# Patient Record
Sex: Male | Born: 1970 | Race: White | Hispanic: No | Marital: Married | Smoking: Current every day smoker
Health system: Southern US, Community
[De-identification: ages and names within clinical notes are randomized; demographics above are authoritative.]

## PROBLEM LIST (undated history)

## (undated) DIAGNOSIS — I219 Acute myocardial infarction, unspecified: Secondary | ICD-10-CM

## (undated) DIAGNOSIS — S069XAA Unspecified intracranial injury with loss of consciousness status unknown, initial encounter: Secondary | ICD-10-CM

## (undated) DIAGNOSIS — I2510 Atherosclerotic heart disease of native coronary artery without angina pectoris: Secondary | ICD-10-CM

## (undated) DIAGNOSIS — G939 Disorder of brain, unspecified: Secondary | ICD-10-CM

## (undated) DIAGNOSIS — N19 Unspecified kidney failure: Secondary | ICD-10-CM

## (undated) DIAGNOSIS — N200 Calculus of kidney: Secondary | ICD-10-CM

## (undated) DIAGNOSIS — G4730 Sleep apnea, unspecified: Secondary | ICD-10-CM

## (undated) DIAGNOSIS — R011 Cardiac murmur, unspecified: Secondary | ICD-10-CM

## (undated) DIAGNOSIS — S069X9A Unspecified intracranial injury with loss of consciousness of unspecified duration, initial encounter: Secondary | ICD-10-CM

## (undated) HISTORY — DX: Unspecified kidney failure: N19

## (undated) HISTORY — DX: Sleep apnea, unspecified: G47.30

## (undated) HISTORY — DX: Calculus of kidney: N20.0

## (undated) HISTORY — PX: OTHER SURGICAL HISTORY: SHX169

## (undated) HISTORY — DX: Disorder of brain, unspecified: G93.9

## (undated) HISTORY — DX: Unspecified intracranial injury with loss of consciousness of unspecified duration, initial encounter: S06.9X9A

## (undated) HISTORY — DX: Cardiac murmur, unspecified: R01.1

---

## 2005-01-03 HISTORY — PX: CORONARY ANGIOPLASTY WITH STENT PLACEMENT: SHX49

## 2015-03-16 ENCOUNTER — Emergency Department
Admission: EM | Admit: 2015-03-16 | Discharge: 2015-03-16 | Disposition: A | Discharge status: Home / Self Care | Payer: Medicare Other | Attending: Emergency Medicine | Admitting: Emergency Medicine

## 2015-03-16 ENCOUNTER — Emergency Department: Payer: Medicare Other

## 2015-03-16 ENCOUNTER — Encounter: Payer: Self-pay | Admitting: Emergency Medicine

## 2015-03-16 DIAGNOSIS — N23 Unspecified renal colic: Secondary | ICD-10-CM | POA: Diagnosis not present

## 2015-03-16 DIAGNOSIS — N289 Disorder of kidney and ureter, unspecified: Secondary | ICD-10-CM | POA: Insufficient documentation

## 2015-03-16 DIAGNOSIS — N200 Calculus of kidney: Secondary | ICD-10-CM | POA: Diagnosis not present

## 2015-03-16 DIAGNOSIS — Z87828 Personal history of other (healed) physical injury and trauma: Secondary | ICD-10-CM | POA: Insufficient documentation

## 2015-03-16 DIAGNOSIS — R109 Unspecified abdominal pain: Secondary | ICD-10-CM

## 2015-03-16 DIAGNOSIS — F17210 Nicotine dependence, cigarettes, uncomplicated: Secondary | ICD-10-CM | POA: Diagnosis not present

## 2015-03-16 HISTORY — DX: Acute myocardial infarction, unspecified: I21.9

## 2015-03-16 HISTORY — DX: Atherosclerotic heart disease of native coronary artery without angina pectoris: I25.10

## 2015-03-16 LAB — URINALYSIS COMPLETE WITH MICROSCOPIC (ARMC ONLY)
Bilirubin Urine: NEGATIVE
Glucose, UA: NEGATIVE mg/dL
Leukocytes, UA: NEGATIVE
Nitrite: NEGATIVE
PROTEIN: 100 mg/dL — AB
Specific Gravity, Urine: 1.033 — ABNORMAL HIGH (ref 1.005–1.030)
pH: 5 (ref 5.0–8.0)

## 2015-03-16 LAB — CBC
HCT: 44.8 % (ref 40.0–52.0)
HEMOGLOBIN: 15.1 g/dL (ref 13.0–18.0)
MCH: 31.4 pg (ref 26.0–34.0)
MCHC: 33.6 g/dL (ref 32.0–36.0)
MCV: 93.3 fL (ref 80.0–100.0)
Platelets: 222 10*3/uL (ref 150–440)
RBC: 4.8 MIL/uL (ref 4.40–5.90)
RDW: 13.4 % (ref 11.5–14.5)
WBC: 13.6 10*3/uL — ABNORMAL HIGH (ref 3.8–10.6)

## 2015-03-16 LAB — COMPREHENSIVE METABOLIC PANEL
ALBUMIN: 4.4 g/dL (ref 3.5–5.0)
ALK PHOS: 63 U/L (ref 38–126)
ALT: 18 U/L (ref 17–63)
ANION GAP: 10 (ref 5–15)
AST: 38 U/L (ref 15–41)
BUN: 17 mg/dL (ref 6–20)
CALCIUM: 9.4 mg/dL (ref 8.9–10.3)
CO2: 22 mmol/L (ref 22–32)
Chloride: 106 mmol/L (ref 101–111)
Creatinine, Ser: 1.74 mg/dL — ABNORMAL HIGH (ref 0.61–1.24)
GFR calc Af Amer: 53 mL/min — ABNORMAL LOW (ref >60)
GFR calc non Af Amer: 46 mL/min — ABNORMAL LOW (ref >60)
GLUCOSE: 150 mg/dL — AB (ref 65–99)
Potassium: 3.8 mmol/L (ref 3.5–5.1)
SODIUM: 138 mmol/L (ref 135–145)
Total Bilirubin: 1.3 mg/dL — ABNORMAL HIGH (ref 0.3–1.2)
Total Protein: 6.9 g/dL (ref 6.5–8.1)

## 2015-03-16 LAB — LIPASE, BLOOD: Lipase: 20 U/L (ref 11–51)

## 2015-03-16 MED ORDER — HYDROMORPHONE HCL 2 MG PO TABS
2.0000 mg | ORAL_TABLET | Freq: Once | ORAL | Status: AC
  Administered 2015-03-16: 2 mg via ORAL
  Filled 2015-03-16: qty 1

## 2015-03-16 MED ORDER — ONDANSETRON HCL 4 MG/2ML IJ SOLN
4.0000 mg | Freq: Once | INTRAMUSCULAR | Status: AC
  Administered 2015-03-16: 4 mg via INTRAVENOUS
  Filled 2015-03-16: qty 2

## 2015-03-16 MED ORDER — SODIUM CHLORIDE 0.9 % IV BOLUS (SEPSIS)
500.0000 mL | Freq: Once | INTRAVENOUS | Status: AC
  Administered 2015-03-16: 500 mL via INTRAVENOUS

## 2015-03-16 MED ORDER — ONDANSETRON HCL 4 MG PO TABS: 4.0000 mg | ORAL_TABLET | Freq: Three times a day (TID) | ORAL | Status: DC | PRN

## 2015-03-16 MED ORDER — KETOROLAC TROMETHAMINE 30 MG/ML IJ SOLN
30.0000 mg | Freq: Once | INTRAMUSCULAR | Status: AC
  Administered 2015-03-16: 30 mg via INTRAVENOUS
  Filled 2015-03-16: qty 1

## 2015-03-16 MED ORDER — HYDROMORPHONE HCL 2 MG PO TABS: 2.0000 mg | ORAL_TABLET | Freq: Four times a day (QID) | ORAL | Status: DC | PRN

## 2015-03-16 NOTE — ED Notes (Signed)
AAOx3.  Skin warm and dry.  States pain is 0/10.  Urine strainer given.  D/C home

## 2015-03-16 NOTE — ED Notes (Signed)
Resting.  Easy to wake  NAD.  Continue to monitor.

## 2015-03-16 NOTE — Discharge Instructions (Signed)
Kidney Stones °Kidney stones (urolithiasis) are deposits that form inside your kidneys. The intense pain is caused by the stone moving through the urinary tract. When the stone moves, the ureter goes into spasm around the stone. The stone is usually passed in the urine.  °CAUSES  °· A disorder that makes certain neck glands produce too much parathyroid hormone (primary hyperparathyroidism). °· A buildup of uric acid crystals, similar to gout in your joints. °· Narrowing (stricture) of the ureter. °· A kidney obstruction present at birth (congenital obstruction). °· Previous surgery on the kidney or ureters. °· Numerous kidney infections. °SYMPTOMS  °· Feeling sick to your stomach (nauseous). °· Throwing up (vomiting). °· Blood in the urine (hematuria). °· Pain that usually spreads (radiates) to the groin. °· Frequency or urgency of urination. °DIAGNOSIS  °· Taking a history and physical exam. °· Blood or urine tests. °· CT scan. °· Occasionally, an examination of the inside of the urinary bladder (cystoscopy) is performed. °TREATMENT  °· Observation. °· Increasing your fluid intake. °· Extracorporeal shock wave lithotripsy--This is a noninvasive procedure that uses shock waves to break up kidney stones. °· Surgery may be needed if you have severe pain or persistent obstruction. There are various surgical procedures. Most of the procedures are performed with the use of small instruments. Only small incisions are needed to accommodate these instruments, so recovery time is minimized. °The size, location, and chemical composition are all important variables that will determine the proper choice of action for you. Talk to your health care provider to better understand your situation so that you will minimize the risk of injury to yourself and your kidney.  °HOME CARE INSTRUCTIONS  °· Drink enough water and fluids to keep your urine clear or pale yellow. This will help you to pass the stone or stone fragments. °· Strain  all urine through the provided strainer. Keep all particulate matter and stones for your health care provider to see. The stone causing the pain may be as small as a grain of salt. It is very important to use the strainer each and every time you pass your urine. The collection of your stone will allow your health care provider to analyze it and verify that a stone has actually passed. The stone analysis will often identify what you can do to reduce the incidence of recurrences. °· Only take over-the-counter or prescription medicines for pain, discomfort, or fever as directed by your health care provider. °· Keep all follow-up visits as told by your health care provider. This is important. °· Get follow-up X-rays if required. The absence of pain does not always mean that the stone has passed. It may have only stopped moving. If the urine remains completely obstructed, it can cause loss of kidney function or even complete destruction of the kidney. It is your responsibility to make sure X-rays and follow-ups are completed. Ultrasounds of the kidney can show blockages and the status of the kidney. Ultrasounds are not associated with any radiation and can be performed easily in a matter of minutes. °· Make changes to your daily diet as told by your health care provider. You may be told to: °¨ Limit the amount of salt that you eat. °¨ Eat 5 or more servings of fruits and vegetables each day. °¨ Limit the amount of meat, poultry, fish, and eggs that you eat. °· Collect a 24-hour urine sample as told by your health care provider. You may need to collect another urine sample every 6-12   months. SEEK MEDICAL CARE IF:  You experience pain that is progressive and unresponsive to any pain medicine you have been prescribed. SEEK IMMEDIATE MEDICAL CARE IF:   Pain cannot be controlled with the prescribed medicine.  You have a fever or shaking chills.  The severity or intensity of pain increases over 18 hours and is not  relieved by pain medicine.  You develop a new onset of abdominal pain.  You feel faint or pass out.  You are unable to urinate.   This information is not intended to replace advice given to you by your health care provider. Make sure you discuss any questions you have with your health care provider.   Document Released: 12/20/2004 Document Revised: 09/10/2014 Document Reviewed: 05/23/2012 Else[employer] Interactive Patient Education [employer]2016 [employer]   Please return immediately if condition worsens. Please contact her primary physician or the physician you were given for referral. If you have any specialist physicians involved in her treatment and plan please also contact them. Thank you for using [facility]. Please drink plenty of fluids and strain urine. Please save the stone if one becomes available. Return here to emergency department especially for uncontrolled pain, uncontrolled vomiting, or fever. He can take over-the-counter Tylenol as needed for pain also.

## 2015-03-16 NOTE — ED Provider Notes (Signed)
Time Seen: Approximately *----------------------------------------- 7:48 PM on 03/16/2015 -----------------------------------------   I have reviewed the triage notes  Chief Complaint: Abdominal Pain   History of Present Illness: Steven Archer is a 45 y.o. male who presents with acute onset approximately 2:30 this afternoon of left-sided abdominal pain. His family members with him states that he has a history of a total brain injury and there is previous concern for giving pain medication. Patient had a history of recent nausea and vomiting without changes in his mental status. No loose stool or diarrhea. The family member states that he's described issues of bowel urgency and urinary tension urgency. They're not aware of any loose stool or diarrhea, melena or hematochezia. No hematemesis.   Past Medical History  Diagnosis Date  . MI (myocardial infarction) (HCC)   . Coronary artery disease     There are no active problems to display for this patient.   Past Surgical History  Procedure Laterality Date  . Coronary angioplasty with stent placement      Past Surgical History  Procedure Laterality Date  . Coronary angioplasty with stent placement      No current outpatient prescriptions on file.  Allergies:  Review of patient's allergies indicates no known allergies.  Family History: No family history on file.  Social History: Social History  Substance Use Topics  . Smoking status: Current Every Day Smoker -- 1.00 packs/day    Types: Cigarettes  . Smokeless tobacco: Never Used  . Alcohol Use: Yes     Comment: rare     Review of Systems:   10 point review of systems was performed and was otherwise negative: Patient himself is a poor historian due to his history of total brain injury and is not able to offer any review of systems. Review of systems mainly acquired from the family members with the patient Constitutional: No fever Eyes: No visual disturbances ENT:  No sore throat, ear pain Cardiac: No chest pain Respiratory: No shortness of breath, wheezing, or stridor Abdomen: Pain is exclusively on the left side abdomen the patient points to the left lower quadrant. The pain radiates toward the left flank area. He denies any testicular pain Endocrine: No weight loss, No night sweats Extremities: No peripheral edema, cyanosis Skin: No rashes, easy bruising Neurologic: No focal weakness, trouble with speech or swollowing Urologic: No dysuria, Hematuria, or urinary frequency   Physical Exam:  ED Triage Vitals  Enc Vitals Group     BP 03/16/15 1922 109/75 mmHg     Pulse Rate 03/16/15 1922 75     Resp 03/16/15 1922 20     Temp 03/16/15 1922 98.4 F (36.9 C)     Temp Source 03/16/15 1922 Oral     SpO2 03/16/15 1922 100 %     Weight 03/16/15 1922 165 lb (74.844 kg)     Height 03/16/15 1922  (1.778 m)     Head Cir --      Peak Flow --      Pain Score 03/16/15 1923 10     Pain Loc --      Pain Edu? --      Excl. in GC? --     General: Awake , Alert , and Oriented times 3; GCS 15 patient appears to be in obvious discomfort Head: Normal cephalic , atraumatic Eyes: Pupils equal , round, reactive to light Nose/Throat: No nasal drainage, patent upper airway without erythema or exudate.  Neck: Supple, Full range of motion,  No anterior adenopathy or palpable thyroid masses Lungs: Clear to ascultation without wheezes , rhonchi, or rales Heart: Regular rate, regular rhythm without murmurs , gallops , or rubs Abdomen: Soft, non tender without rebound, guarding , or rigidity; bowel sounds positive and symmetric in all 4 quadrants. No organomegaly .   No obvious reproducible abdominal or flank pain     Extremities: 2 plus symmetric pulses. No edema, clubbing or cyanosis Neurologic: normal ambulation, Motor symmetric without deficits, sensory intact Skin: warm, dry, no rashes   Labs:   All laboratory work was reviewed including any pertinent  negatives or positives listed below:  Labs Reviewed  CBC - Abnormal; Notable for the following:    WBC 13.6 (*)    All other components within normal limits  URINALYSIS COMPLETEWITH MICROSCOPIC ([facility] ONLY) - Abnormal; Notable for the following:    Color, Urine AMBER (*)    APPearance CLEAR (*)    Ketones, ur 1+ (*)    Specific Gravity, Urine 1.033 (*)    Hgb urine dipstick 3+ (*)    Protein, ur 100 (*)    Bacteria, UA RARE (*)    Squamous Epithelial / LPF 0-5 (*)    All other components within normal limits  LIPASE, BLOOD  COMPREHENSIVE METABOLIC PANEL   of note is some indications of prerenal insufficiency.  Radiology: *    EXAM: CT ABDOMEN AND PELVIS WITHOUT CONTRAST  TECHNIQUE: Multidetector CT imaging of the abdomen and pelvis was performed following the standard protocol without IV contrast.  COMPARISON: None.  FINDINGS: Lung bases are normal.  Abdominal images demonstrate a normal liver, spleen, pancreas, gallbladder and adrenal glands. Stomach is normal.  Kidneys are normal in size without evidence of nephrolithiasis. There is minimal dilatation of the left intrarenal collecting system and ureter. There is a 1-2 mm stone at the left UVJ. Right ureter is normal.  Appendix is normal. Small bowel and colon are within normal. Mesentery is normal.  Minimal calcified plaque over the abdominal aorta and iliac arteries.  Pelvic images demonstrate the bladder, prostate and rectum to be within normal. Remaining bones and soft tissues are within normal.  IMPRESSION: 1-2 mm stone at the left UVJ causing low-grade obstruction.   Electronically Signed By: Elberta Fortis M.D. On: 03/16/2015 20:33   I personally reviewed the radiologic studies    ED Course:  The patient has a history of a traumatic brain injury and his caretaker is with him at the bedside states they were told by multiple neurologists not to given any medication that may alter his mental  status. Patient had a clinical presentation for what appeared to be renal colic. He was given Toradol with some symptomatic improvement but still had some consistent pain. His CAT scan shows what appears to be a small partially obstructing left-sided kidney stone at the left UVJ region. The stone has a very good chance of passing in the near future. Patient still had persistent pain and also has the appearance of renal insufficiency by laboratory work. Given the elevated BUN/creatinine ratio this most likely is prerenal. I felt it was not due to the patient's kidney stone at this time especiall given the size and partial  obstructive pattern. This however makes it difficult to give the patient nonsteroidal medications this may further affect his kidney function. Patient was given oral Dilaudid here which seemed eradicated his pain and he was able to maintain essentially his baseline mental function and felt symptomatically improved. I felt that the  narcotic pain medication was necessary due to renal colic and that I could not foresee any further damage or persistent symptoms from brief interrupted tablets of Dilaudid for pain. The caretaker was prescribed Dilaudid just for severe pain. Advised to use over-the-counter Tylenol for other discomfort. Patient was referred to urology unassigned and [facility] what the patient's basic baseline creatinine level is. I felt we could safely discharge the patient does encouraged to drink plenty of fluids and they're advised to return here for any new concerns    Assessment: * Acute renal colic Renal insufficiency  Final Clinical Impression:  Final diagnoses:  Left sided abdominal pain     Plan:  Outpatient management Patient was advised to return immediately if condition worsens. Patient was advised to follow up with their primary care physician or other specialized physicians involved in their outpatient  care             Jennye MoccasinBrian S Quigley, MD 03/16/15 [id number]

## 2015-03-16 NOTE — ED Notes (Signed)
C/o lower abdominal pain.  Onset of symptoms this morning.  C/o suprapubic pain and "difficulty urinating" started today.  alos c/o left side and left lower back pain.

## 2015-03-16 NOTE — ED Notes (Signed)
TO CT Scan via stretcher 

## 2015-03-30 ENCOUNTER — Ambulatory Visit (INDEPENDENT_AMBULATORY_CARE_PROVIDER_SITE_OTHER): Payer: Medicare Other | Admitting: Urology

## 2015-03-30 ENCOUNTER — Encounter: Payer: Self-pay | Admitting: Urology

## 2015-03-30 VITALS — BP 93/64 | HR 73 | Ht 70.0 in | Wt 157.7 lb

## 2015-03-30 DIAGNOSIS — R310 Gross hematuria: Secondary | ICD-10-CM | POA: Diagnosis not present

## 2015-03-30 DIAGNOSIS — Z87442 Personal history of urinary calculi: Secondary | ICD-10-CM | POA: Diagnosis not present

## 2015-03-30 DIAGNOSIS — R1030 Lower abdominal pain, unspecified: Secondary | ICD-10-CM | POA: Diagnosis not present

## 2015-03-30 LAB — MICROSCOPIC EXAMINATION
BACTERIA UA: NONE SEEN
EPITHELIAL CELLS (NON RENAL): NONE SEEN /HPF (ref 0–10)
RBC, UA: NONE SEEN /hpf (ref 0–?)
WBC, UA: NONE SEEN /hpf (ref 0–?)

## 2015-03-30 LAB — URINALYSIS, COMPLETE
Bilirubin, UA: NEGATIVE
GLUCOSE, UA: NEGATIVE
Ketones, UA: NEGATIVE
LEUKOCYTES UA: NEGATIVE
Nitrite, UA: NEGATIVE
PROTEIN UA: NEGATIVE
RBC, UA: NEGATIVE
Specific Gravity, UA: 1.02 (ref 1.005–1.030)
Urobilinogen, Ur: 0.2 mg/dL (ref 0.2–1.0)
pH, UA: 6.5 (ref 5.0–7.5)

## 2015-03-30 LAB — BLADDER SCAN AMB NON-IMAGING: Scan Result: 13

## 2015-03-30 NOTE — Progress Notes (Signed)
03/30/2015 11:48 AM   Steven Archer 11-15-1970 [id number]  Referring provider: No referring provider defined for this encounter.  Chief Complaint  Patient presents with  . Nephrolithiasis    referred by Dr. Clint GuyLindley     HPI: Patient is a 45 year old Caucasian male who was seen in the [facility] ED on 03/16/2015 and diagnosed with a 1-2 mm left UVJ stone causing low-grade obstruction with a follow [facility] ED on 03/24/2015 which noted no UVJ or hydronephrosis who is still having pain.  Patient states about 2 weeks ago he had the sudden onset of lower abdominal pain which radiated more towards the left lower quadrant.  He also stated that it radiated to his left flank.  [facility] was [facility] emergency room on 03/16/2015.  At that time, he has 6-30 RBCs per high-power field on urinalysis, a serum creatinine of 1.74 and the CT scan which noted a 1-2 mm left UVJ stone causing a low-grade obstruction.  He was instructed to strain his urine and was discharged with Percocet, Flomax and Zofran.  The pain continued and they sought further treatment and [facility] department on 03/24/2015.  CT scan performed at that time noted no ureterolithiasis, no hydronephrosis or other acute findings.  Patient's urinalysis from that visit was negative for RBC"s and his creatinine was 1.24.  He states he had an episode of gross hematuria after he returned from the emergency room that evening.  At some point, patient states ultrasound was performed at his primary care physician's office and was told that he had bilateral nephrolithiasis and a 12 mm stone in his ureter.  I do not have access to those records.    Today, patient is still complaining of the lower abdominal pain. He denies any further gross hematuria or dysuria.  He has baseline urinary symptoms of nocturia, intermittency, hesitancy, straining to urinate and a weak urinary stream.  PVR today's visit was 13 mL. His UA today was  normal.  I cannot personally review either CT at this time. The CT scan performed at [facility]'s emergency room may not be loaded into the system for I cannot access and on canopy.     PMH: Past Medical History  Diagnosis Date  . MI (myocardial infarction) (HCC)   . Coronary artery disease   . Heart murmur   . Kidney failure   . Sleep apnea   . 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 Kidney stone     Surgical History: Past Surgical History  Procedure Laterality Date  . Coronary angioplasty with stent placement  2007    Home Medications:    Medication List       This list is accurate as of: 03/30/15 11:48 AM.  Always use your most recent med list.               aspirin EC 81 MG tablet  Take 81 mg by mouth daily.     HYDROmorphone 2 MG tablet  Commonly known as:  DILAUDID  Take 1 tablet (2 mg total) by mouth every 6 (six) hours as needed for severe pain.     ibuprofen 800 MG tablet  Commonly known as:  ADVIL,MOTRIN  Take 800 mg by mouth every 8 (eight) hours as needed.     ondansetron 4 MG tablet  Commonly known as:  ZOFRAN  Take 1 tablet (4 mg total) by mouth every  8 (eight) hours as needed for nausea or vomiting.     oxyCODONE-acetaminophen 5-325 MG tablet  Commonly known as:  PERCOCET/ROXICET  Take by mouth. Reported on 03/30/2015     predniSONE 10 MG tablet  Commonly known as:  DELTASONE  Take 10 mg by mouth.     rOPINIRole 0.25 MG tablet  Commonly known as:  REQUIP  Take 0.5 mg by mouth at bedtime. Reported on 03/30/2015     tamsulosin 0.4 MG Caps capsule  Commonly known as:  FLOMAX  Take 0.4 mg by mouth.        Allergies: No Known Allergies  Family History: Family History  Problem Relation Age of Onset  . Kidney disease Neg Hx   . Prostate cancer Neg Hx   . Urolithiasis Father     Social History:  reports that he has been smoking Cigarettes.  He has been smoking about 1.00 pack per day. He has never used smokeless tobacco. He reports that  he drinks alcohol. He reports that he does not use illicit drugs.  ROS: UROLOGY Frequent Urination?: No Hard to postpone urination?: No Burning/pain with urination?: No Get up at night to urinate?: Yes Leakage of urine?: No Urine stream starts and stops?: Yes Trouble starting stream?: Yes Do you have to strain to urinate?: Yes Blood in urine?: Yes Urinary tract infection?: No Sexually transmitted disease?: No Injury to kidneys or bladder?: No Painful intercourse?: No Weak stream?: Yes Erection problems?: No Penile pain?: No  Gastrointestinal Nausea?: Yes Vomiting?: Yes Indigestion/heartburn?: No Diarrhea?: No Constipation?: No  Constitutional Fever: Yes Night sweats?: No Weight loss?: Yes Fatigue?: Yes  Skin Skin rash/lesions?: No Itching?: No  Eyes Blurred vision?: Yes Double vision?: No  Ears/Nose/Throat Sore throat?: No Sinus problems?: No  Hematologic/Lymphatic Swollen glands?: No Easy bruising?: No  Cardiovascular Leg swelling?: No Chest pain?: Yes  Respiratory Cough?: No Shortness of breath?: Yes  Endocrine Excessive thirst?: No  Musculoskeletal Back pain?: Yes Joint pain?: No  Neurological Headaches?: Yes Dizziness?: Yes  Psychologic Depression?: No Anxiety?: No  Physical Exam: BP 93/64 mmHg  Pulse 73  Ht  (1.778 m)  Wt 157 lb 11.2 oz (71.532 kg)  BMI 22.63 kg/m2  Constitutional: Well nourished. Alert and oriented, No acute distress. HEENT: [place] AT, moist mucus membranes. Trachea midline, no masses. Cardiovascular: No clubbing, cyanosis, or edema. Respiratory: Normal respiratory effort, no increased work of breathing. GI: Abdomen is soft, non tender, non distended, no abdominal masses. Liver and spleen not palpable.  No hernias appreciated.  Stool sample for occult testing is not indicated.   GU: No CVA tenderness.  No bladder fullness or masses.  Patient with circumcised phallus.   Urethral meatus is patent.  No penile  discharge. No penile lesions or rashes. Scrotum without lesions, cysts, rashes and/or edema.  Testicles are located scrotally bilaterally and they are atrophic.  No masses are appreciated in the testicles. Left and right epididymis are normal. Rectal: Patient with  normal sphincter tone. Anus and perineum without scarring or rashes. No rectal masses are appreciated. Prostate is approximately 50 grams, no nodules are appreciated. Seminal vesicles are normal. Skin: No rashes, bruises or suspicious lesions. Lymph: No cervical or inguinal adenopathy. Neurologic: Grossly intact, no focal deficits, moving all 4 extremities. Psychiatric: Normal mood and affect.  Laboratory Data: Lab Results  Component Value Date   WBC 13.6* 03/16/2015   HGB 15.1 03/16/2015   HCT 44.8 03/16/2015   MCV 93.3 03/16/2015   PLT 222  03/16/2015    Lab Results  Component Value Date   CREATININE 1.74* 03/16/2015    Lab Results  Component Value Date   AST 38 03/16/2015   Lab Results  Component Value Date   ALT 18 03/16/2015     Urinalysis Results for orders placed or performed in visit on 03/30/15  Microscopic Examination  Result Value Ref Range   WBC, UA None seen 0 -  5 /hpf   RBC, UA None seen 0 -  2 /hpf   Epithelial Cells (non renal) None seen 0 - 10 /hpf   Bacteria, UA None seen None seen/Few  Urinalysis, Complete  Result Value Ref Range   Specific Gravity, UA 1.020 1.005 - 1.030   pH, UA 6.5 5.0 - 7.5   Color, UA Yellow Yellow   Appearance Ur Clear Clear   Leukocytes, UA Negative Negative   Protein, UA Negative Negative/Trace   Glucose, UA Negative Negative   Ketones, UA Negative Negative   RBC, UA Negative Negative   Bilirubin, UA Negative Negative   Urobilinogen, Ur 0.2 0.2 - 1.0 mg/dL   Nitrite, UA Negative Negative   Microscopic Examination See below:   BLADDER SCAN AMB NON-IMAGING  Result Value Ref Range   Scan Result 13     Pertinent Imaging: CLINICAL DATA: Acute left flank  pain since this morning.  EXAM: CT ABDOMEN AND PELVIS WITHOUT CONTRAST  TECHNIQUE: Multidetector CT imaging of the abdomen and pelvis was performed following the standard protocol without IV contrast.  COMPARISON: None.  FINDINGS: Lung bases are normal.  Abdominal images demonstrate a normal liver, spleen, pancreas, gallbladder and adrenal glands. Stomach is normal.  Kidneys are normal in size without evidence of nephrolithiasis. There is minimal dilatation of the left intrarenal collecting system and ureter. There is a 1-2 mm stone at the left UVJ. Right ureter is normal.  Appendix is normal. Small bowel and colon are within normal. Mesentery is normal.  Minimal calcified plaque over the abdominal aorta and iliac arteries.  Pelvic images demonstrate the bladder, prostate and rectum to be within normal. Remaining bones and soft tissues are within normal.  IMPRESSION: 1-2 mm stone at the left UVJ causing low-grade obstruction.   Electronically Signed  By: Elberta Fortis M.D.  On: 03/16/2015 20:33  EXAM: CT abdomen and pelvis without contrast DATE: 03/24/15 13:45:00 ACCESSION: [id number] UN DICTATED: 03/24/15 13:55:09 INTERPRETATION LOCATION: [facility]  CLINICAL INDICATION: 45 Year Old (M): CALCULUS OF URETER-    COMPARISON: CT abdomen and pelvis 10/28/11  TECHNIQUE: A spiral CT scan was obtained without IV contrast from the top of the kidneys through the entire bladder.  Images were reconstructed in the axial plane. Coronal and sagittal reformatted images were also provided for further evaluation.  FINDINGS: Evaluation of the solid organs and vasculature is limited in the absence of intravenous contrast.  VISUALIZED CHEST: The lung bases are clear. Visualized heart is within normal limits.  ABDOMEN/PELVIS: Hepatobiliary: Unremarkable.  Gallbladder: Unremarkable.  Spleen: Unremarkable.  Pancreas: Unremarkable.  Adrenal glands:  Unremarkable. Kidneys: No evidence of urinary calculi.  Bowel/Peritoneum: No free intraperitoneal gas. The large and small bowel are unremarkable. The appendix is not visualized, however there are no secondary signs of appendicitis.  There is no evidence of bowel obstruction. No free fluid.  Pelvis: The pelvic organs are within normal limits. The bladder is unremarkable.  Vasculature: The aorta is normal in size and contour. Scattered calcified atherosclerotic plaque. Unremarkable inferior vena cava.   Lymph Nodes: There is  no abdominal or pelvic lymphadenopathy by size criteria.   Soft Tissue/Bones: There are no acute osseous abnormalities or focal osseous lesions.  IMPRESSION: No urolithiasis.  Results for SHIV, SHUEY (MRN [id number]) as of 03/30/2015 13:19  Ref. Range 03/30/2015 11:56  Scan Result Unknown 13   Assessment & Plan:    1. Gross hematuria:   Patient endorses an episode of gross hematuria after a negative CT scan for stones.   He is a smoker and is high risk for GU malignancies.   I explained to him it would be prudent at this time to undergo cystoscopy with bilateral retrogrades to complete his hematuria workup.  He may have a GU malignancy.   I did emphasize that this may not find an etiology for his lower abdominal pain as all the current urological studies are negative.   He stated he would like to speak with his primary care physician and obtain their opinion whether they should undergo this study.   - Urinalysis, Complete - CULTURE, URINE COMPREHENSIVE  2.  Lower abdominal pain:   Patient with lower abdominal pain.   I have advised the patient to seek further evaluation with his primary care physician as his urological studies are negative.     3. Left UVJ stone:   Patient most likely spontaneously passed the 2 mm left UVJ stone.  He was not visible on the CT scan performed at [facility] on 03/24/2015.  His UA today is unremarkable.     Return for follow up with his  PCP.  These notes generated with voice recognition software. I apologize for typographical errors.  Michiel Cowboy, PA-C  [facility] [address], [address] [place], [place] [address] [phone]

## 2015-04-01 LAB — CULTURE, URINE COMPREHENSIVE

## 2015-04-02 ENCOUNTER — Telehealth: Payer: Self-pay

## 2015-04-02 NOTE — Telephone Encounter (Signed)
LMOM

## 2015-04-02 NOTE — Telephone Encounter (Signed)
-----   Message from Harle BattiestShannon A McGowan, PA-C sent at 04/01/2015  2:07 PM EDT ----- Patient does not have an urinary tract infection.  Has he made a decision about having bilateral retrogrades for the blood in his urine?

## 2015-04-06 NOTE — Telephone Encounter (Signed)
Spoke with pt and wife in reference to UTI and bilateral retrogrades. Both stated that PCP drew a PSA that was normal therefore he do not want retrogrades done at this time.

## 2015-04-06 NOTE — Telephone Encounter (Signed)
We will need to send the patient a certified letter.  It should say,  Mr. Steven Archer: I have recommended that you undergo cystoscopy with bilateral retrogrades, this is were you are put to sleep and a small instrument is passed into your bladder to visialize the inside of the bladder and then shoot dye up the tubes that drain the kidneys to look for cancer.  This is because you had an episode of urinating blood.  Urinating blood is a possible sign of cancer.  A CT scan that is performed with out dye cannot completely rule out a kidney or bladder cancer.  You are a smoker and this puts you at higher risk for these cancers.  A PSA will not rule out bladder or kidney cancer.  PSA is a tool we use to evaluate a man for prostate cancer.  In the [place], approximately 77,000 new cases and 16,000 deaths occur each year due to bladder cancer.  Please contact our office to schedule this procedure.

## 2015-04-07 ENCOUNTER — Telehealth: Payer: Self-pay

## 2015-04-07 NOTE — Telephone Encounter (Signed)
A certified letter was sent. Ref #- [id number]

## 2018-01-23 ENCOUNTER — Ambulatory Visit
Admission: RE | Admit: 2018-01-23 | Discharge: 2018-01-23 | Disposition: A | Discharge status: Home / Self Care | Payer: Medicare Other | Attending: Adult Health | Admitting: Adult Health

## 2018-01-23 ENCOUNTER — Ambulatory Visit
Admission: RE | Admit: 2018-01-23 | Discharge: 2018-01-23 | Disposition: A | Discharge status: Home / Self Care | Payer: Medicare Other | Source: Ambulatory Visit | Attending: Adult Health | Admitting: Adult Health

## 2018-01-23 ENCOUNTER — Other Ambulatory Visit: Payer: Self-pay | Admitting: Adult Health

## 2018-01-23 DIAGNOSIS — R0602 Shortness of breath: Secondary | ICD-10-CM

## 2018-01-23 DIAGNOSIS — R059 Cough, unspecified: Secondary | ICD-10-CM

## 2018-01-23 DIAGNOSIS — R05 Cough: Secondary | ICD-10-CM

## 2018-01-23 DIAGNOSIS — R52 Pain, unspecified: Secondary | ICD-10-CM | POA: Insufficient documentation

## 2019-06-26 ENCOUNTER — Ambulatory Visit: Payer: Medicare Other | Admitting: Physical Therapy

## 2019-06-26 ENCOUNTER — Encounter: Payer: Medicare Other | Admitting: Speech Pathology

## 2019-07-01 ENCOUNTER — Ambulatory Visit: Payer: Medicare Other | Attending: Physical Medicine and Rehabilitation | Admitting: Physical Therapy

## 2019-07-01 ENCOUNTER — Encounter: Payer: Self-pay | Admitting: Physical Therapy

## 2019-07-01 ENCOUNTER — Other Ambulatory Visit: Payer: Self-pay

## 2019-07-01 DIAGNOSIS — M6281 Muscle weakness (generalized): Secondary | ICD-10-CM

## 2019-07-01 DIAGNOSIS — R41841 Cognitive communication deficit: Secondary | ICD-10-CM | POA: Diagnosis present

## 2019-07-01 DIAGNOSIS — R2681 Unsteadiness on feet: Secondary | ICD-10-CM | POA: Insufficient documentation

## 2019-07-01 DIAGNOSIS — Z9181 History of falling: Secondary | ICD-10-CM | POA: Insufficient documentation

## 2019-07-02 NOTE — Therapy (Signed)
[facility] MAIN [facility] SERVICES [address] [place], [place], [address] Phone: [phone]   Fax:  [phone]  Physical Therapy Evaluation  Patient Details  Name: Steven Archer MRN: [id number] Date of Birth: [birth year] Referring Provider (PT): Shuping, Huston Foley MD   Encounter Date: 07/01/2019   PT End of Session - 07/02/19 0949    Visit Number 1    Number of Visits 17    Date for PT Re-Evaluation 08/27/19    PT Start Time 1442    PT Stop Time 1530    PT Time Calculation (min) 48 min    Equipment Utilized During Treatment Gait belt    Activity Tolerance Patient tolerated treatment well;No increased pain    Behavior During Therapy Flat affect           Past Medical History:  Diagnosis Date  . Brain damage    Blow to head with hammer  . Coronary artery disease   . Heart murmur   . Kidney failure   . Kidney stone   . MI (myocardial infarction) (HCC)   . Sleep apnea     Past Surgical History:  Procedure Laterality Date  . CORONARY ANGIOPLASTY WITH STENT PLACEMENT  2007    There were no vitals filed for this visit.    Subjective Assessment - 07/01/19 1444    Subjective "I was denied rehab after my accident and I still have trouble moving around"    Pertinent History 49 yo Male s/p TBI (hit in head at work) in 2014. He was denied worker's comp and then he was unable to qualify for [employer]. Therefore he has not had any rehab since accident. He has had multiple falls with most recent being in Dec 2020. His wife was present for evaluation. She reports he has had some cognitive changes since recent fall. However they deny any physical changes; He presents to therapy without AD. He reports that occasionally he will use a walking stick; In addition he will hold onto furniture when walking; He reports intermittent tingling/numbness in LUE and LLE; He also reports that his left leg will give out from time to time; Patient  reports a history of dizziness, with feeling "drunk" when walking; He also reports some difficulty with transfers;    Limitations Standing;Walking;House hold activities    How long can you sit comfortably? 30 min;    How long can you stand comfortably? unsure    How long can you walk comfortably? about 500 feet;    Diagnostic tests CT scan obtained in January 2021 showed no acute intracranial abnormalities, had MRI done 6/22, unsure of results;    Patient Stated Goals "be able to walk better. reduce fall risk/history"- walk normal    Currently in Pain? Yes    Pain Score 7     Pain Location Head    Pain Orientation Left    Pain Descriptors / Indicators Sharp;Throbbing    Pain Type Chronic pain    Pain Onset More than a month ago    Pain Frequency Constant    Aggravating Factors  light (has sensitivity to light and sound)    Pain Relieving Factors darkness, nerve blocks    Effect of Pain on Daily Activities unsure    Multiple Pain Sites No              [facility] PT Assessment - 07/01/19 1457      Assessment   Medical Diagnosis s/p TBI  2014, fall 2020    Referring Provider (PT) Shuping, Huston FoleyLee Thomas MD    Onset Date/Surgical Date --   2014, had a fall in Dec 2020   Next MD Visit this week    Prior Therapy was denied rehab following accident; No skilled PT intervention for this condition;       Precautions   Precautions Fall    Required Braces or Orthoses --   none     Restrictions   Weight Bearing Restrictions No      Balance Screen   Has the patient fallen in the past 6 months Yes    How many times? 1      Home Environment   Living Environment Private residence    Living Arrangements Spouse/significant other    Type of Home House    Home Access Stairs to enter    Entrance Stairs-Number of Steps 2-3    Entrance Stairs-Rails None    Home Layout One level    Home Equipment Shower seat - built in;Cane - single point      Prior Function   Level of Independence Independent      Vocation On disability    Vocation Requirements Worked in carpentry    Leisure fish, sitting watching TV;      Cognition   Overall Cognitive Status Impaired/Different from baseline      Observation/Other Assessments   Observations pleasant man, sits in chair with frequent twitches, left eye mostly closed; cranial nerves intact and present    Focus on Therapeutic Outcomes (FOTO)  46/100      Sensation   Light Touch Impaired by gross assessment      Coordination   Gross Motor Movements are Fluid and Coordinated Yes    Fine Motor Movements are Fluid and Coordinated No    Finger Nose Finger Test slightly dysmetric and slower on left compared to right    Heel Shin Test accurate bilaterally;      Posture/Postural Control   Posture Comments WFL      ROM / Strength   AROM / PROM / Strength AROM;Strength      AROM   Overall AROM Comments BLE are [facility] Institute[facility]FL      Strength   Strength Assessment Site Hip;Knee;Ankle    Right/Left Hip Right;Left    Right Hip Flexion 5/5    Right Hip Extension 4+/5    Right Hip ABduction 4+/5    Left Hip Flexion 4/5    Left Hip Extension 4-/5    Left Hip ABduction 4/5    Right/Left Knee Right;Left    Right Knee Flexion 5/5    Right Knee Extension 5/5    Left Knee Flexion 4/5    Left Knee Extension 4/5    Right/Left Ankle Right;Left    Right Ankle Dorsiflexion 5/5    Left Ankle Dorsiflexion 4/5      Transfers   Comments patient requires min A when transferred without pushing on arm rests with significantly unstable transfer with anterior loss of balance;       Ambulation/Gait   Gait Comments when ambulating, patient exhibits wide base of support, decreased ankle DF with heavy toe strike and pronation during initial contact, increased knee flexion during initial contact/mid stance with knee recuvatum during mid/terminal stance; requires CGA for safety without AD      Standardized Balance Assessment   Standardized Balance Assessment 10 meter walk  test;Five Times Sit to Stand    Five times sit to stand comments  24.2 sec with arms across chest, min A for safety with instability noted    10 Meter Walk 1.05 m/s without AD, min A for safety      High Level Balance   High Level Balance Comments When standing on firm surface, feet apart, pt exhibits increased lateral and anterior/posterior sway requiring min A for balance/stability; Patient has a hard time with static standing unsupported;                      Objective measurements completed on examination: See above findings.               PT Education - 07/02/19 0949    Education Details plan of care, recommendations    Person(s) Educated Patient;Spouse    Methods Explanation    Comprehension Verbalized understanding            PT Short Term Goals - 07/02/19 1000      PT SHORT TERM GOAL #1   Title Patient will be adherent to HEP at least 3x a week to improve functional strength and balance for better safety at home.    Time 4    Period Weeks    Status New    Target Date 07/30/19      PT SHORT TERM GOAL #2   Title Patient will increase BLE gross strength to 4+/5 as to improve functional strength for independent gait, increased standing tolerance and increased ADL ability.    Time 4    Period Weeks    Status New    Target Date 07/30/19             PT Long Term Goals - 07/02/19 1000      PT LONG TERM GOAL #1   Title Patient will be able to transfer sit<>Stand from a chair independently without pushing on arm rests exhibiting good safety awareness for return to PLOF.    Time 8    Period Weeks    Status New    Target Date 08/27/19      PT LONG TERM GOAL #2   Title Patient will ambulate supervision on level surface without AD with good foot clearance and no evidence of loss of balance for improved gait safety in home.    Time 8    Period Weeks    Status New    Target Date 08/27/19      PT LONG TERM GOAL #3   Title Patient will improve  FOTO score to >53/100 to indicate improved functional mobility;    Time 8    Period Weeks    Status New    Target Date 08/27/19      PT LONG TERM GOAL #4   Title Patient (< 31 years Archer) will complete five times sit to stand test in < 10 seconds indicating an increased LE strength and improved balance.    Time 8    Period Weeks    Status New    Target Date 08/27/19      PT LONG TERM GOAL #5   Title Patient will increase six minute walk test distance to >1000 for progression to community ambulator and improve gait ability    Time 8    Period Weeks    Status New    Target Date 08/27/19      Additional Long Term Goals   Additional Long Term Goals Yes      PT LONG TERM GOAL #6   Title Patient  will demonstrate an improved Berg Balance Score of > 50/56 as to demonstrate improved balance with ADLs such as sitting/standing and transfer balance and reduced fall risk.    Time 8    Period Weeks    Status New    Target Date 08/27/19                  Plan - 07/02/19 0950    Clinical Impression Statement 49 yo Male presents to therapy with complaints of difficulty walking and frequent falls after sustaining a TBI at work in 2014. Patient also recently had a significant fall in December 2020. After recent fall his wife reports that she has noticed more cognitive changes and agitated behavior. Patient had repeat CT scan with no acute abnormality. He is unsure of recent MRI results. Patient  exhibits mild weakness on LLE. he was able to exhibit good motor control with heel to shin movement. However upon standing patient exhibits increased unsteadiness with poor motor control during ambulation. He tested as a high fall risk as evidenced with 10 meter walk and 5 times sit<>Stand. He would benefit from additional assessment for balance as therapist unable to complete today due to time contraints. He would benefit from additional skilled PT intervention to improve strength, balance and mobility.     Personal Factors and Comorbidities Comorbidity 3+;Past/Current Experience;Time since onset of injury/illness/exacerbation;Education;Behavior Pattern    Comorbidities Past MI, CAD, kidney disease, sleep apnea, TBI, frequent falls    Examination-Activity Limitations Caring for Others;Locomotion Level;Squat;Stairs;Stand;Transfers    Examination-Participation Restrictions Church;Cleaning;Community Activity;Driving;Laundry;Medication Management;Personal Finances;Shop;Volunteer;Yard Work    Conservation officer, historic buildings Unstable/Unpredictable    Visual merchandiser    Rehab Potential Fair    PT Frequency 2x / week    PT Duration 8 weeks    PT Treatment/Interventions Cryotherapy;Canalith Repostioning;Electrical Stimulation;Moist Heat;Gait training;Stair training;Functional mobility training;Therapeutic activities;Therapeutic exercise;Balance training;Neuromuscular re-education;Cognitive remediation;Patient/family education;Energy conservation;Dry needling;Vestibular;Visual/perceptual remediation/compensation    PT Next Visit Plan assess balance, initiate HEP    PT Home Exercise Plan will address next session    Recommended Other Services has referral for ST for cognitive changes    Consulted and Agree with Plan of Care Patient           Patient will benefit from skilled therapeutic intervention in order to improve the following deficits and impairments:  Abnormal gait, Decreased balance, Decreased endurance, Decreased mobility, Difficulty walking, Impaired perceived functional ability, Dizziness, Decreased activity tolerance, Decreased coordination, Decreased safety awareness, Decreased strength, Pain  Visit Diagnosis: Muscle weakness (generalized)  Unsteadiness on feet  History of falling     Problem List Patient Active Problem List   Diagnosis Date Noted  . History of renal stone 03/30/2015  . Lower abdominal pain 03/30/2015  . Gross hematuria 03/30/2015     Steven Archer PT, Steven Archer 07/02/2019, 10:04 AM  [place] [facility] MAIN [facility] SERVICES [address] [place], [place], [address] Phone: [phone]   Fax:  [phone]  Name: Steven Archer MRN: [id number] Date of Birth: Mar 16, 1970

## 2019-07-03 ENCOUNTER — Other Ambulatory Visit: Payer: Self-pay

## 2019-07-03 ENCOUNTER — Ambulatory Visit: Payer: Medicare Other | Admitting: Speech Pathology

## 2019-07-03 ENCOUNTER — Encounter: Payer: Self-pay | Admitting: Speech Pathology

## 2019-07-03 DIAGNOSIS — M6281 Muscle weakness (generalized): Secondary | ICD-10-CM | POA: Diagnosis not present

## 2019-07-03 DIAGNOSIS — R41841 Cognitive communication deficit: Secondary | ICD-10-CM

## 2019-07-03 NOTE — Therapy (Addendum)
[place] [facility] MAIN [facility] SERVICES [address] [place], [place], [address] Phone: [phone]   Fax:  [phone]  Speech Language Pathology Evaluation  Patient Details  Name: Steven Archer MRN: [id number] Date of Birth: 1970-08-17 Referring Provider (SLP): Huston Foley Shuping   Encounter Date: 07/03/2019   End of Session - 07/03/19 1506    Visit Number 1    Number of Visits 25    Date for SLP Re-Evaluation 09/25/19    Authorization Type Medicare    Authorization Time Period 07/03/2019 thru 09/25/2019    Authorization - Visit Number 1    Progress Note Due on Visit 10    SLP Start Time 1300    SLP Stop Time  1400    SLP Time Calculation (min) 60 min    Activity Tolerance Patient tolerated treatment well           Past Medical History:  Diagnosis Date  . Brain damage    Blow to head with hammer  . Coronary artery disease   . Heart murmur   . Kidney failure   . Kidney stone   . MI (myocardial infarction) (HCC)   . Sleep apnea     Past Surgical History:  Procedure Laterality Date  . CORONARY ANGIOPLASTY WITH STENT PLACEMENT  2007    There were no vitals filed for this visit.   Subjective Assessment - 07/03/19 1500    Subjective pt pleasant    Patient is accompained by: Family member    Currently in Pain? Yes    Pain Score 7     Pain Location Head    Pain Orientation Left    Pain Descriptors / Indicators Sharp;Throbbing    Pain Type Chronic pain    Pain Onset More than a month ago    Pain Frequency Constant    Aggravating Factors  light (has sensitivity to light and sound)    Pain Relieving Factors darkness, nerve blocks    Multiple Pain Sites No              SLP Evaluation [facility] - 07/03/19 1500      SLP Visit Information   SLP Received On 07/03/19    Referring Provider (SLP) Huston Foley Shuping    Onset Date 2014    Medical Diagnosis TBI      Subjective   Subjective Pt appropriate, engaged, fair historian     Patient/Family Stated Goal for his memory to get better      General Information   HPI Steven Archer is a 49 y.o. male with a past medical history of TBI (2014) with worsening mood, balance, and cognitive changes after a fall in December 2020. CT head in Jan 2021 was negative for intracranial pathology. Most significant concern today worsening cognitive issues, mood outbursts, irritability and frequent falls that appear to worsen after a fall in December 2020. Patient struck his head in February 2014 after he hit his head against a beam at work. He is unsure about LOC. He does not remember the injury. Wife remembers him coming home with a large area of swelling over his head. At that time, she did ask him about the events leading to his injury, he was able to recall pieces of the events in 2014. Onset of symptoms started in March 2014 including headaches, dizziness, cognitive changes, altered mental status, frequent falls. He only had a few sessions of therapy at time of initial injury. Most recent MRI (  06/25/2019) revealed scattered periventricular and deep white matter T2/FLAIR hyperintensities, which can be seen in chronic small vessel ischemic disease. Otherwise, unremarkable MRI of the brain.     Behavioral/Cognition appropriate    Mobility Status ambulatory      Prior Functional Status   Cognitive/Linguistic Baseline Within functional limits    Type of Home House     Lives With Spouse    Available Support Family;Friend(s)    Vocation On disability      Cognition   Overall Cognitive Status Impaired/Different from baseline    Area of Impairment Attention;Memory    Current Attention Level Selective    Memory Decreased short-term memory    Attention Selective    Selective Attention Appears intact    Memory Impaired    Memory Impairment Retrieval deficit;Decreased recall of new information;Decreased short term memory    Decreased Short Term Memory Verbal basic;Functional basic     Awareness Appears intact    Problem Solving Appears intact      Auditory Comprehension   Overall Auditory Comprehension Appears within functional limits for tasks assessed      Visual Recognition/Discrimination   Discrimination Within Function Limits      Reading Comprehension   Reading Status Within funtional limits      Expression   Primary Mode of Expression Verbal      Verbal Expression   Overall Verbal Expression Appears within functional limits for tasks assessed      Written Expression   Dominant Hand Right    Written Expression Within Functional Limits      Oral Motor/Sensory Function   Overall Oral Motor/Sensory Function Appears within functional limits for tasks assessed      Motor Speech   Overall Motor Speech Appears within functional limits for tasks assessed      Standardized Assessments   Standardized Assessments  Cognitive Linguistic Quick Test      Cognitive Linguistic Quick Test (Ages 18-69)   Attention WNL    Memory Moderate    Executive Function WNL    Language WNL    Visuospatial Skills WNL    Severity Rating Total 18    Composite Severity Rating 14.8           Pt presents with moderate impairment in visual, procedural, short-term memory and retrieval of information. The Cognitive Linguistic Quick Test (CLQT) was administered to assess the relative status of five cognitive domains: attention, memory, language, executive functioning, and visuospatial skills. Scores from 10 tasks were used to estimate severity ratings (for age groups 18-69 years and 70-89 years) for each domain, a clock drawing task, as well as an overall composite severity rating of cognition.   Pt's memory impairments were further confirmed below criterion scores in design memory, story retelling and recall of personal facts such as his address. His wife further confirms that pt has difficulty recalling taking his medicine, needs reminders to brush his teeth and recalling daily  information. This is further complicated by pt's sensitivity to light and chronic intense pain over left eye.  Skilled ST is required to improve pt's cognitive deficits to increase functional independence and safety with medication management and daily safety.    Long Term Goal: Pt will improve recall of information related to daily activities to increase safety with medication administration and participation in daily activities. Baseline - moderate impairments Time: 12 weeks Status: New Target Date: 09/25/2019     SLP Education - 07/03/19 1506    Education Details Results of assessment  and POC to target memory    Person(s) Educated Patient;Spouse    Methods Explanation;Demonstration    Comprehension Verbalized understanding                Plan - 07/03/19 1508    Speech Therapy Frequency 2x / week    Duration --   12 weeks   Treatment/Interventions Compensatory strategies;Cognitive reorganization;Internal/external aids;Compensatory techniques;SLP instruction and feedback;Patient/family education;Functional tasks    Potential to Achieve Goals Good    SLP Home Exercise Plan given    Consulted and Agree with Plan of Care Patient;Family member/caregiver    Family Member Consulted wife           Patient will benefit from skilled therapeutic intervention in order to improve the following deficits and impairments:   Cognitive communication deficit    Problem List Patient Active Problem List   Diagnosis Date Noted  . History of renal stone 03/30/2015  . Lower abdominal pain 03/30/2015  . Gross hematuria 03/30/2015   Rebakah Cokley B. Dreama Saa M.S., CCC-SLP, [facility] Speech-Language Pathologist Rehabilitation Services Office [phone]  Reuel Derby 07/03/2019, 3:09 PM  [facility] MAIN [facility] SERVICES [address] [place], [place], [address] Phone: [phone]   Fax:  [phone]  Name: Steven Archer MRN: [id number] Date of  Birth: 06/15/1970

## 2019-07-10 ENCOUNTER — Encounter: Payer: Self-pay | Admitting: Physical Therapy

## 2019-07-10 ENCOUNTER — Other Ambulatory Visit: Payer: Self-pay

## 2019-07-10 ENCOUNTER — Ambulatory Visit: Payer: Medicare Other | Attending: Physical Medicine and Rehabilitation | Admitting: Physical Therapy

## 2019-07-10 DIAGNOSIS — M6281 Muscle weakness (generalized): Secondary | ICD-10-CM

## 2019-07-10 DIAGNOSIS — R41841 Cognitive communication deficit: Secondary | ICD-10-CM | POA: Diagnosis present

## 2019-07-10 DIAGNOSIS — Z9181 History of falling: Secondary | ICD-10-CM | POA: Diagnosis present

## 2019-07-10 DIAGNOSIS — R2681 Unsteadiness on feet: Secondary | ICD-10-CM | POA: Diagnosis present

## 2019-07-10 NOTE — Therapy (Signed)
[place] [facility] MAIN [facility] SERVICES [address] [place], [place], [address] Phone: [phone]   Fax:  [phone]  Physical Therapy Treatment  Patient Details  Name: Steven Archer MRN: [id number] Date of Birth: 1970-05-27 Referring Provider (PT): Shuping, Huston Foley MD   Encounter Date: 07/10/2019   PT End of Session - 07/10/19 1441    Visit Number 2    Number of Visits 17    Date for PT Re-Evaluation 08/27/19    PT Start Time 1432    PT Stop Time 1515    PT Time Calculation (min) 43 min    Equipment Utilized During Treatment Gait belt    Activity Tolerance Patient tolerated treatment well;No increased pain    Behavior During Therapy Flat affect           Past Medical History:  Diagnosis Date   Brain damage    Blow to head with hammer   Coronary artery disease    Heart murmur    Kidney failure    Kidney stone    MI (myocardial infarction) (HCC)    Sleep apnea     Past Surgical History:  Procedure Laterality Date   CORONARY ANGIOPLASTY WITH STENT PLACEMENT  2007    There were no vitals filed for this visit.   Subjective Assessment - 07/10/19 1439    Subjective Patient reports continued headache and LLE knee pain. He denies any falls in last few days. 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 He was denied worker's comp and then he was unable to qualify for [employer]. Therefore he has not had any rehab since accident. He has had multiple falls with most recent being in Dec 2020. His wife was present for evaluation. She reports he has had some cognitive changes since recent fall. However they deny any physical changes; He presents to therapy without AD. He reports that occasionally he will use a walking stick; In addition he will hold onto furniture when walking; He reports intermittent tingling/numbness in LUE and  LLE; He also reports that his left leg will give out from time to time; Patient reports a history of dizziness, with feeling "drunk" when walking; He also reports some difficulty with transfers;    Limitations Standing;Walking;House hold activities    How long can you sit comfortably? 30 min;    How long can you stand comfortably? unsure    How long can you walk comfortably? about 500 feet;    Diagnostic tests CT scan obtained in January 2021 showed no acute intracranial abnormalities, had MRI done 6/22, unsure of results;    Patient Stated Goals "be able to walk better. reduce fall risk/history"- walk normal    Currently in Pain? Yes    Pain Score 8     Pain Location Head    Pain Orientation Left    Pain Descriptors / Indicators Sharp;Throbbing    Pain Type Chronic pain    Pain Onset More than a month ago    Pain Frequency Constant    Aggravating Factors  light    Pain Relieving Factors darkness/nerve blocks    Effect of Pain on Daily Activities unsure    Multiple Pain Sites Yes    Pain Score 5    Pain Location Knee    Pain Orientation Left    Pain Descriptors / Indicators  Aching;Sore    Pain Type Chronic pain    Pain Onset More than a month ago    Pain Frequency Intermittent    Aggravating Factors  anything    Pain Relieving Factors knee brace helps some, ice pack    Effect of Pain on Daily Activities decreased activity tolerance;              [facility] PT Assessment - 07/11/19 0001      Standardized Balance Assessment   Standardized Balance Assessment Berg Balance Test      Berg Balance Test   Sit to Stand Needs minimal aid to stand or to stabilize    Standing Unsupported Needs several tries to stand 30 seconds unsupported    Sitting with Back Unsupported but Feet Supported on Floor or Stool Able to sit 2 minutes under supervision    Stand to Sit Controls descent by using hands    Transfers Able to transfer with verbal cueing and /or supervision    Standing Unsupported  with Eyes Closed Needs help to keep from falling    Standing Unsupported with Feet Together Needs help to attain position and unable to hold for 15 seconds    From Standing, Reach Forward with Outstretched Arm Reaches forward but needs supervision    From Standing Position, Pick up Object from Floor Able to pick up shoe, needs supervision    From Standing Position, Turn to Look Behind Over each Shoulder Needs assist to keep from losing balance and falling    Turn 360 Degrees Needs close supervision or verbal cueing    Standing Unsupported, Alternately Place Feet on Step/Stool Able to complete >2 steps/needs minimal assist    Standing Unsupported, One Foot in [employer] Loses balance while stepping or standing    Standing on One Leg Tries to lift leg/unable to hold 3 seconds but remains standing independently    Total Score 17    Berg comment: 100% risk for falls         TREATMENT: Warm up on Nustep BUE/BLE level 2x4 min (Unbilled); Instructed patient in balance activities to address safety, see above:  Instructed patient in balance exercise:  Sitting in chair: Sensory re-orientation Unsupported eyes open/closed 15 sec hold x2 reps; Required min VCs and visual cues for proper trunk positioning; educated patient to use mirror or target at home to facilitate improved trunk control;   Standing on firm surface with finger tip hold, feet apart: -side/side weight shift x5 reps; -heel raises x10 reps; Required close supervision to CGA for safety;   Standing one foot on firm surface one foot on 4 inch step: -unsupported standing 15 sec hold -head turns side/side x5 reps each; -eyes closed 10 sec hold, left foot on step, right foot on floor with min A and imbalance noted; While standing with left foot on floor and right foot on step, with head turns side/side patient exhibited left knee buckle with immediate loss of balance requiring mod A to avoid fall; He denies any pain but states, "It just does  that."    Patient tolerated session well. He does exhibit increased ataxic/unsteady movement while standing especially when unsupported. He exhibits poor vestibular input with immediate loss of balance with eyes closed.                    PT Education - 07/10/19 1441    Education Details balance/HEP    Person(s) Educated Patient;Spouse    Methods Explanation;Demonstration;Verbal cues  Comprehension Verbalized understanding;Returned demonstration;Verbal cues required;Need further instruction            PT Short Term Goals - 07/02/19 1000      PT SHORT TERM GOAL #1   Title Patient will be adherent to HEP at least 3x a week to improve functional strength and balance for better safety at home.    Time 4    Period Weeks    Status New    Target Date 07/30/19      PT SHORT TERM GOAL #2   Title Patient will increase BLE gross strength to 4+/5 as to improve functional strength for independent gait, increased standing tolerance and increased ADL ability.    Time 4    Period Weeks    Status New    Target Date 07/30/19             PT Long Term Goals - 07/02/19 1000      PT LONG TERM GOAL #1   Title Patient will be able to transfer sit<>Stand from a chair independently without pushing on arm rests exhibiting good safety awareness for return to PLOF.    Time 8    Period Weeks    Status New    Target Date 08/27/19      PT LONG TERM GOAL #2   Title Patient will ambulate supervision on level surface without AD with good foot clearance and no evidence of loss of balance for improved gait safety in home.    Time 8    Period Weeks    Status New    Target Date 08/27/19      PT LONG TERM GOAL #3   Title Patient will improve FOTO score to >53/100 to indicate improved functional mobility;    Time 8    Period Weeks    Status New    Target Date 08/27/19      PT LONG TERM GOAL #4   Title Patient (< 24 years old) will complete five times sit to stand test in < 10  seconds indicating an increased LE strength and improved balance.    Time 8    Period Weeks    Status New    Target Date 08/27/19      PT LONG TERM GOAL #5   Title Patient will increase six minute walk test distance to >1000 for progression to community ambulator and improve gait ability    Time 8    Period Weeks    Status New    Target Date 08/27/19      Additional Long Term Goals   Additional Long Term Goals Yes      PT LONG TERM GOAL #6   Title Patient will demonstrate an improved Berg Balance Score of > 50/56 as to demonstrate improved balance with ADLs such as sitting/standing and transfer balance and reduced fall risk.    Time 8    Period Weeks    Status New    Target Date 08/27/19                 Plan - 07/11/19 1007    Clinical Impression Statement Patient motivated and participated well within session. Instructed patient in [place] Balance Assessment. he did test as a high fall risk. Patient exhibits increased ataxic movement/unsteadiness when standing unsupported, requiring very wide base of support to avoid falling. PT instructed patient in sensory reorganization exercises in seated for safety to facilitate improved positional awareness with eyes open/closed. Instructed patient in standing  balance exercise. While standing he did have episode of LLE knee buckling requiring mod A to avoid fall. He reports this happens often and is not related to pain but just gives out. Patient would benefit from additional skilled PT Intervention to improve balance and safety;    Personal Factors and Comorbidities Comorbidity 3+;Past/Current Experience;Time since onset of injury/illness/exacerbation;Education;Behavior Pattern    Comorbidities Past MI, CAD, kidney disease, sleep apnea, TBI, frequent falls    Examination-Activity Limitations Caring for Others;Locomotion Level;Squat;Stairs;Stand;Transfers    Examination-Participation Restrictions Church;Cleaning;Community  Activity;Driving;Laundry;Medication Management;Personal Finances;Shop;Volunteer;Yard Work    Stability/Clinical Decision Making Unstable/Unpredictable    Rehab Potential Fair    PT Frequency 2x / week    PT Duration 8 weeks    PT Treatment/Interventions Cryotherapy;Canalith Repostioning;Electrical Stimulation;Moist Heat;Gait training;Stair training;Functional mobility training;Therapeutic activities;Therapeutic exercise;Balance training;Neuromuscular re-education;Cognitive remediation;Patient/family education;Energy conservation;Dry needling;Vestibular;Visual/perceptual remediation/compensation    PT Next Visit Plan assess balance, initiate HEP    PT Home Exercise Plan initiated, see patient instructions;    Consulted and Agree with Plan of Care Patient           Patient will benefit from skilled therapeutic intervention in order to improve the following deficits and impairments:  Abnormal gait, Decreased balance, Decreased endurance, Decreased mobility, Difficulty walking, Impaired perceived functional ability, Dizziness, Decreased activity tolerance, Decreased coordination, Decreased safety awareness, Decreased strength, Pain  Visit Diagnosis: Muscle weakness (generalized)  Unsteadiness on feet  History of falling     Problem List Patient Active Problem List   Diagnosis Date Noted   History of renal stone 03/30/2015   Lower abdominal pain 03/30/2015   Gross hematuria 03/30/2015    Avantika Shere PT, DPT 07/11/2019, 10:11 AM  [place] [facility] MAIN [facility] SERVICES [address] [place], [place], [address] Phone: [phone]   Fax:  [phone]  Name: CHINEDU AGUSTIN MRN: [id number] Date of Birth: 09-18-1970

## 2019-07-10 NOTE — Patient Instructions (Addendum)
Access Code: [email or web address] URL: [email or web address] Date: 07/10/2019 Prepared by: Zettie Pho  Exercises Seated eyes open/closed 15 sec hold x5 reps each x3 sets daily; Side to Side Weight Shift with Counter Support - 1 x daily - 7 x weekly - 2 sets - 10 reps Heel rises with counter support - 1 x daily - 7 x weekly - 2 sets - 10 reps

## 2019-07-12 ENCOUNTER — Ambulatory Visit: Payer: Medicare Other | Admitting: Speech Pathology

## 2019-07-12 ENCOUNTER — Other Ambulatory Visit: Payer: Self-pay

## 2019-07-12 DIAGNOSIS — R41841 Cognitive communication deficit: Secondary | ICD-10-CM

## 2019-07-12 DIAGNOSIS — M6281 Muscle weakness (generalized): Secondary | ICD-10-CM | POA: Diagnosis not present

## 2019-07-12 NOTE — Therapy (Signed)
[facility] MAIN [facility] SERVICES [address] [place], [place], [address] Phone: [phone]   Fax:  [phone]  Speech Language Pathology Treatment  Patient Details  Name: Steven Archer MRN: [id number] Date of Birth: 09-Jan-1970 Referring Provider (SLP): Huston Foley Shuping   Encounter Date: 07/12/2019   End of Session - 07/12/19 1543    Visit Number 2    Number of Visits 25    Date for SLP Re-Evaluation 09/25/19    Authorization Type Medicare    Authorization Time Period 07/03/2019 thru 09/25/2019    Authorization - Visit Number 2    Progress Note Due on Visit 10    SLP Start Time 0900    SLP Stop Time  1000    SLP Time Calculation (min) 60 min    Activity Tolerance Patient tolerated treatment well           Past Medical History:  Diagnosis Date  . Brain damage    Blow to head with hammer  . Coronary artery disease   . Heart murmur   . Kidney failure   . Kidney stone   . MI (myocardial infarction) (HCC)   . Sleep apnea     Past Surgical History:  Procedure Laterality Date  . CORONARY ANGIOPLASTY WITH STENT PLACEMENT  2007    There were no vitals filed for this visit.   Subjective Assessment - 07/12/19 1540    Subjective pt pleasant    Patient is accompained by: Family member    Currently in Pain? Yes    Pain Score 8     Pain Location Head    Pain Orientation Left    Pain Descriptors / Indicators Throbbing    Pain Type Chronic pain    Pain Onset More than a month ago    Pain Frequency Constant                 ADULT SLP TREATMENT - 07/12/19 0001      General Information   Behavior/Cognition Alert;Cooperative;Pleasant mood    HPI JASE REEP is a 49 y.o. male with a past medical history of TBI (2014) with worsening mood, balance, and cognitive changes after a fall in December 2020. CT head in Jan 2021 was negative for intracranial pathology. Most significant concern today worsening cognitive issues,  mood outbursts, irritability and frequent falls that appear to worsen after a fall in December 2020. Patient struck his head in February 2014 after he hit his head against a beam at work. He is unsure about LOC. He does not remember the injury. Wife remembers him coming home with a large area of swelling over his head. At that time, she did ask him about the events leading to his injury, he was able to recall pieces of the events in 2014. Onset of symptoms started in March 2014 including headaches, dizziness, cognitive changes, altered mental status, frequent falls. He only had a few sessions of therapy at time of initial injury. Most recent MRI (06/25/2019) revealed scattered periventricular and deep white matter T2/FLAIR hyperintensities, which can be seen in chronic small vessel ischemic disease. Otherwise, unremarkable MRI of the brain.       Treatment Provided   Treatment provided Cognitive-Linquistic      Pain Assessment   Pain Assessment --      Cognitive-Linquistic Treatment   Treatment focused on Cognition    Skilled Treatment Introduced compensatory external memory strategies: written reminders and use of pill organizer  Assessment / Recommendations / Plan   Plan Continue with current plan of care      Progression Toward Goals   Progression toward goals Progressing toward goals            SLP Education - 07/12/19 1542    Education Details use of daily routine, external memory strategies    Person(s) Educated Patient;Spouse    Methods Explanation;Demonstration    Comprehension Verbalized understanding              SLP Long Term Goals - 07/05/19 1017      SLP LONG TERM GOAL #1   Title Pt will improve recall of information related to daily activities to increase safety with medication administration and participation in daily activities.            Plan - 07/12/19 1543    Clinical Impression Statement Skilled treatment session targeted pt's cognitive  communication goals, specifically pt's memory goals. SLP facilitated session by providing review of various external memory strategies to aid in recall of information. Specifically use of pill organizer and written post-it notes to aid in recall of activities of daily living (such as brushing his teeth). Education provided on pt and his wife creating a daily routine/schedule to aid in recall of daily activities.    Speech Therapy Frequency 2x / week    Duration --   12 weeks   Treatment/Interventions Compensatory strategies;Cognitive reorganization;Internal/external aids;Compensatory techniques;SLP instruction and feedback;Patient/family education;Functional tasks    Potential to Achieve Goals Fair    Potential Considerations Previous level of function;Cooperation/participation level;Financial resources;Severity of impairments;Pain level    SLP Home Exercise Plan given    Consulted and Agree with Plan of Care Patient;Family member/caregiver    Family Member Consulted wife           Patient will benefit from skilled therapeutic intervention in order to improve the following deficits and impairments:   Cognitive communication deficit    Problem List Patient Active Problem List   Diagnosis Date Noted  . History of renal stone 03/30/2015  . Lower abdominal pain 03/30/2015  . Gross hematuria 03/30/2015   Mizraim Harmening B. Dreama Saa M.S., CCC-SLP, [facility] Speech-Language Pathologist Rehabilitation Services Office [phone]  Reuel Derby 07/12/2019, 3:45 PM  [place] [facility] MAIN [facility] SERVICES [address] [place], [place], [address] Phone: [phone]   Fax:  [phone]   Name: NAMISH KRISE MRN: [id number] Date of Birth: 1970-07-21

## 2019-07-15 ENCOUNTER — Ambulatory Visit: Payer: Medicare Other

## 2019-07-15 ENCOUNTER — Other Ambulatory Visit: Payer: Self-pay

## 2019-07-15 ENCOUNTER — Ambulatory Visit: Payer: Medicare Other | Admitting: Speech Pathology

## 2019-07-15 DIAGNOSIS — M6281 Muscle weakness (generalized): Secondary | ICD-10-CM

## 2019-07-15 DIAGNOSIS — R2681 Unsteadiness on feet: Secondary | ICD-10-CM

## 2019-07-15 DIAGNOSIS — R41841 Cognitive communication deficit: Secondary | ICD-10-CM

## 2019-07-15 NOTE — Therapy (Signed)
[facility] MAIN [facility] SERVICES [address] [place], [place], [address] Phone: [phone]   Fax:  [phone]  Physical Therapy Treatment  Patient Details  Name: Steven Archer MRN: [id number] Date of Birth: August 28, 1970 Referring Provider (PT): Shuping, Huston Foley MD   Encounter Date: 07/15/2019   PT End of Session - 07/15/19 1559    Visit Number 3    Number of Visits 17    Date for PT Re-Evaluation 08/27/19    PT Start Time 1600    PT Stop Time 1645    PT Time Calculation (min) 45 min    Equipment Utilized During Treatment Gait belt    Activity Tolerance Patient tolerated treatment well;No increased pain    Behavior During Therapy Flat affect           Past Medical History:  Diagnosis Date  . Brain damage    Blow to head with hammer  . Coronary artery disease   . Heart murmur   . Kidney failure   . Kidney stone   . MI (myocardial infarction) (HCC)   . Sleep apnea     Past Surgical History:  Procedure Laterality Date  . CORONARY ANGIOPLASTY WITH STENT PLACEMENT  2007    There were no vitals filed for this visit.   Subjective Assessment - 07/15/19 1558    Subjective Patient reports continued headache and LLE knee pain. He denies any falls in last few days. 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 He was denied worker's comp and then he was unable to qualify for [employer]. Therefore he has not had any rehab since accident. He has had multiple falls with most recent being in Dec 2020. His wife was present for evaluation. She reports he has had some cognitive changes since recent fall. However they deny any physical changes; He presents to therapy without AD. He reports that occasionally he will use a walking stick; In addition he will hold onto furniture when walking; He reports intermittent tingling/numbness in LUE and  LLE; He also reports that his left leg will give out from time to time; Patient reports a history of dizziness, with feeling "drunk" when walking; He also reports some difficulty with transfers;    Limitations Standing;Walking;House hold activities    How long can you sit comfortably? 30 min;    How long can you stand comfortably? unsure    How long can you walk comfortably? about 500 feet;    Diagnostic tests CT scan obtained in January 2021 showed no acute intracranial abnormalities, had MRI done 6/22, unsure of results;    Patient Stated Goals "be able to walk better. reduce fall risk/history"- walk normal    Currently in Pain? Yes    Pain Location Head    Pain Orientation Left    Pain Descriptors / Indicators Throbbing    Pain Type Chronic pain    Pain Onset More than a month ago    Pain Frequency Constant              TREATMENT   Neuromuscular Re-education  Pt reports vertigo with certain position changes. Performed limited oculomotor/vestibular testing however very difficult to interpret due to blepharospasms and L eye being mostly closed. Smooth pursuit as well as horizontal/vertical saccades all appear normal. Unable to interpret head impulse or VOR cancellation testing. BPPV  testing performed (see below)  BPPV TESTS:  Symptoms Duration Intensity Nystagmus  L Dix-Hallpike None   None  R Dix-Hallpike None   None  L Head Roll None   None  R Head Roll None   None    Warm up on Nustep BUE/BLE level 2x4 min during history (3 minutes unbilled);  Sitting in chair unsupported eyes open/closed x 30s each; Sitting unsupported horizontal ball passes between hands with horizontal head turns and head/eye follow x 30s, pt requires cues for distance of ball from eyes and amount of head turn performed; Sitting unsupported vertical ball vertical ball lifts with vertical head turns and head/eye follow x 30s; Standing static balance with WBOS/NBOS eyes open x 30s each; Standing balance  WBOS/NBOS horizontal followed by vertical head turns x 30s each; Standing balance WBOS/NBOS eyes closed x 30s each, pt is considerably less steady with eyes closed and requires occasional minA+1 due to LOB, cues required to prevent him from holding onto rail; Semitandem balance without UE support, eyes open, alternating forward LE x 30s each;   Patient tolerated session well. He does exhibit increased ataxic/unsteady movement while standing especially when unsupported. He exhibits poor vestibular input with immediate loss of balance with eyes closed.    Pt demonstrates excellent motivation during session today. He experiences tremors throughout session and intermittent myclonic jerking. Wife reports that all of his symptoms have been present since his initial injury in 2014. He is considerably less stable with his eyes closed in both sitting and standing requiring intermittent minA+1 for added stability. Performed BPPV testing today and it is all negative. Additional oculomotor/vestibular testing difficult due to frequent blinking and difficulty keeping eyes open. Pt encouraged to follow-up as scheduled. Pt will benefit from PT services to address deficits in strength, balance, and mobility in order to return to full function at home.                                 PT Short Term Goals - 07/02/19 1000      PT SHORT TERM GOAL #1   Title Patient will be adherent to HEP at least 3x a week to improve functional strength and balance for better safety at home.    Time 4    Period Weeks    Status New    Target Date 07/30/19      PT SHORT TERM GOAL #2   Title Patient will increase BLE gross strength to 4+/5 as to improve functional strength for independent gait, increased standing tolerance and increased ADL ability.    Time 4    Period Weeks    Status New    Target Date 07/30/19             PT Long Term Goals - 07/02/19 1000      PT LONG TERM GOAL #1   Title  Patient will be able to transfer sit<>Stand from a chair independently without pushing on arm rests exhibiting good safety awareness for return to PLOF.    Time 8    Period Weeks    Status New    Target Date 08/27/19      PT LONG TERM GOAL #2   Title Patient will ambulate supervision on level surface without AD with good foot clearance and no evidence of loss of balance for improved gait safety in home.    Time 8    Period Weeks  Status New    Target Date 08/27/19      PT LONG TERM GOAL #3   Title Patient will improve FOTO score to >53/100 to indicate improved functional mobility;    Time 8    Period Weeks    Status New    Target Date 08/27/19      PT LONG TERM GOAL #4   Title Patient (< 65 years old) will complete five times sit to stand test in < 10 seconds indicating an increased LE strength and improved balance.    Time 8    Period Weeks    Status New    Target Date 08/27/19      PT LONG TERM GOAL #5   Title Patient will increase six minute walk test distance to >1000 for progression to community ambulator and improve gait ability    Time 8    Period Weeks    Status New    Target Date 08/27/19      Additional Long Term Goals   Additional Long Term Goals Yes      PT LONG TERM GOAL #6   Title Patient will demonstrate an improved Berg Balance Score of > 50/56 as to demonstrate improved balance with ADLs such as sitting/standing and transfer balance and reduced fall risk.    Time 8    Period Weeks    Status New    Target Date 08/27/19                 Plan - 07/15/19 1600    Clinical Impression Statement Pt demonstrates excellent motivation during session today. He experiences tremors throughout session and intermittent myclonic jerking. Wife reports that all of his symptoms have been present since his initial injury in 2014. He is considerably less stable with his eyes closed in both sitting and standing requiring intermittent minA+1 for added stability.  Performed BPPV testing today and it is all negative. Additional oculomotor/vestibular testing difficult due to frequent blinking and difficulty keeping eyes open. Pt encouraged to follow-up as scheduled. Pt will benefit from PT services to address deficits in strength, balance, and mobility in order to return to full function at home.    Personal Factors and Comorbidities Comorbidity 3+;Past/Current Experience;Time since onset of injury/illness/exacerbation;Education;Behavior Pattern    Comorbidities Past MI, CAD, kidney disease, sleep apnea, TBI, frequent falls    Examination-Activity Limitations Caring for Others;Locomotion Level;Squat;Stairs;Stand;Transfers    Examination-Participation Restrictions Church;Cleaning;Community Activity;Driving;Laundry;Medication Management;Personal Finances;Shop;Volunteer;Yard Work    Stability/Clinical Decision Making Unstable/Unpredictable    Rehab Potential Fair    PT Frequency 2x / week    PT Duration 8 weeks    PT Treatment/Interventions Cryotherapy;Canalith Repostioning;Electrical Stimulation;Moist Heat;Gait training;Stair training;Functional mobility training;Therapeutic activities;Therapeutic exercise;Balance training;Neuromuscular re-education;Cognitive remediation;Patient/family education;Energy conservation;Dry needling;Vestibular;Visual/perceptual remediation/compensation    PT Next Visit Plan assess balance, initiate HEP    PT Home Exercise Plan initiated, see patient instructions;    Consulted and Agree with Plan of Care Patient           Patient will benefit from skilled therapeutic intervention in order to improve the following deficits and impairments:  Abnormal gait, Decreased balance, Decreased endurance, Decreased mobility, Difficulty walking, Impaired perceived functional ability, Dizziness, Decreased activity tolerance, Decreased coordination, Decreased safety awareness, Decreased strength, Pain  Visit Diagnosis: Muscle weakness  (generalized)  Unsteadiness on feet     Problem List Patient Active Problem List   Diagnosis Date Noted  . History of renal stone 03/30/2015  . Lower abdominal pain 03/30/2015  . Michaell Cowing  hematuria 03/30/2015   Lynnea MaizesJason D Savier Trickett PT, DPT, GCS  Kyera Felan 07/15/2019, 5:27 PM  [facility] MAIN [facility] [address] [place], [place], [address] Phone: [phone]   Fax:  [phone]  Name: Steven Archer MRN: [id number] Date of Birth: 08/26/1970

## 2019-07-16 NOTE — Therapy (Signed)
[facility] MAIN [facility] SERVICES [address] [place], [place], [address] Phone: [phone]   Fax:  [phone]  Speech Language Pathology Treatment  Patient Details  Name: Steven Archer MRN: [id number] Date of Birth: [birth year] Referring Provider (SLP): Huston Foley Shuping   Encounter Date: 07/15/2019   End of Session - 07/16/19 0944    Visit Number 3    Number of Visits 25    Date for SLP Re-Evaluation 09/25/19    Authorization Type Medicare    Authorization Time Period 07/03/2019 thru 09/25/2019    Authorization - Visit Number 3    Progress Note Due on Visit 10    SLP Start Time 1500    SLP Stop Time  1600    SLP Time Calculation (min) 60 min    Activity Tolerance Patient tolerated treatment well           Past Medical History:  Diagnosis Date  . Brain damage    Blow to head with hammer  . Coronary artery disease   . Heart murmur   . Kidney failure   . Kidney stone   . MI (myocardial infarction) (HCC)   . Sleep apnea     Past Surgical History:  Procedure Laterality Date  . CORONARY ANGIOPLASTY WITH STENT PLACEMENT  2007    There were no vitals filed for this visit.   Subjective Assessment - 07/16/19 0941    Subjective pt pleasant    Patient is accompained by: Family member    Currently in Pain? No/denies                 ADULT SLP TREATMENT - 07/16/19 0001      General Information   Behavior/Cognition Alert;Cooperative;Pleasant mood    HPI Steven Archer is a 49 y.o. male with a past medical history of TBI (2014) with worsening mood, balance, and cognitive changes after a fall in December 2020. CT head in Jan 2021 was negative for intracranial pathology. Most significant concern today worsening cognitive issues, mood outbursts, irritability and frequent falls that appear to worsen after a fall in December 2020. Patient struck his head in February 2014 after he hit his head against a beam at work. He is  unsure about LOC. He does not remember the injury. Wife remembers him coming home with a large area of swelling over his head. At that time, she did ask him about the events leading to his injury, he was able to recall pieces of the events in 2014. Onset of symptoms started in March 2014 including headaches, dizziness, cognitive changes, altered mental status, frequent falls. He only had a few sessions of therapy at time of initial injury. Most recent MRI (06/25/2019) revealed scattered periventricular and deep white matter T2/FLAIR hyperintensities, which can be seen in chronic small vessel ischemic disease. Otherwise, unremarkable MRI of the brain.       Treatment Provided   Treatment provided Cognitive-Linquistic      Cognitive-Linquistic Treatment   Treatment focused on Cognition    Skilled Treatment continued educaiton on memory strategies external and internal, pt states that he is not aware of why ST was recommended, education provided on referral (i/e. lack of intiial therapies), pt overall not interested in implementing change to increase his memory skills      Assessment / Recommendations / Plan   Plan Continue with current plan of care      Progression Toward Goals   Progression toward goals Not  progressing toward goals (comment)   overall disinterest in implementing strategies within home           SLP Education - 07/16/19 0944    Education Details use o daily routines, schedules, reminders throughout day    Person(s) Educated Patient;Spouse    Methods Explanation;Demonstration    Comprehension Verbalized understanding              SLP Long Term Goals - 07/05/19 1017      SLP LONG TERM GOAL #1   Title Pt will improve recall of information related to daily activities to increase safety with medication administration and participation in daily activities.            Plan - 07/16/19 0945    Clinical Impression Statement Pt reports overall disinterest in following thru  with previously recommended memory strategies such as setting alarm on his phone for medication reminder, establishing daily routine to aid in improving memory for daily task including taking his medicine at a prescribed time as well as helping with recall to brush his teeth. SLP promoted discussion with pt and his wife and it appears they have adapted to pt's needs/desires over the last 7 years post TBI and they have various reasons why external memory strategies aren't useful wihtin pt's day.    Speech Therapy Frequency 2x / week    Duration --   12 weeks   Treatment/Interventions Compensatory strategies;Cognitive reorganization;Internal/external aids;Compensatory techniques;SLP instruction and feedback;Patient/family education;Functional tasks    Potential to Achieve Goals Fair    Potential Considerations Previous level of function;Cooperation/participation level;Financial resources;Severity of impairments;Pain level    SLP Home Exercise Plan given    Consulted and Agree with Plan of Care Patient;Family member/caregiver    Family Member Consulted wife           Patient will benefit from skilled therapeutic intervention in order to improve the following deficits and impairments:   Cognitive communication deficit    Problem List Patient Active Problem List   Diagnosis Date Noted  . History of renal stone 03/30/2015  . Lower abdominal pain 03/30/2015  . Gross hematuria 03/30/2015   Ralf Konopka B. Dreama Saa M.S., CCC-SLP, [facility] Speech-Language Pathologist Rehabilitation Services Office [phone]  Reuel Derby 07/16/2019, 9:49 AM  [place] [facility] MAIN [facility] SERVICES [address] [place], [place], [address] Phone: [phone]   Fax:  [phone]   Name: FRANKI STEMEN MRN: [id number] Date of Birth: 10/30/1970

## 2019-07-17 ENCOUNTER — Other Ambulatory Visit: Payer: Self-pay

## 2019-07-17 ENCOUNTER — Ambulatory Visit: Payer: Medicare Other | Admitting: Speech Pathology

## 2019-07-17 ENCOUNTER — Emergency Department
Admission: EM | Admit: 2019-07-17 | Discharge: 2019-07-18 | Disposition: A | Discharge status: Home / Self Care | Payer: Medicare Other | Attending: Vascular Surgery | Admitting: Vascular Surgery

## 2019-07-17 ENCOUNTER — Ambulatory Visit: Payer: Medicare Other

## 2019-07-17 ENCOUNTER — Encounter: Payer: Self-pay | Admitting: Emergency Medicine

## 2019-07-17 ENCOUNTER — Emergency Department: Payer: Medicare Other

## 2019-07-17 VITALS — BP 136/90 | HR 91

## 2019-07-17 DIAGNOSIS — Z5321 Procedure and treatment not carried out due to patient leaving prior to being seen by health care provider: Secondary | ICD-10-CM | POA: Insufficient documentation

## 2019-07-17 DIAGNOSIS — R0789 Other chest pain: Secondary | ICD-10-CM | POA: Insufficient documentation

## 2019-07-17 DIAGNOSIS — R41841 Cognitive communication deficit: Secondary | ICD-10-CM

## 2019-07-17 DIAGNOSIS — R531 Weakness: Secondary | ICD-10-CM | POA: Diagnosis not present

## 2019-07-17 HISTORY — DX: Unspecified intracranial injury with loss of consciousness status unknown, initial encounter: S06.9XAA

## 2019-07-17 LAB — CBC
HCT: 48.7 % (ref 39.0–52.0)
Hemoglobin: 17.7 g/dL — ABNORMAL HIGH (ref 13.0–17.0)
MCH: 34 pg (ref 26.0–34.0)
MCHC: 36.3 g/dL — ABNORMAL HIGH (ref 30.0–36.0)
MCV: 93.7 fL (ref 80.0–100.0)
Platelets: 271 10*3/uL (ref 150–400)
RBC: 5.2 MIL/uL (ref 4.22–5.81)
RDW: 12.7 % (ref 11.5–15.5)
WBC: 6.8 10*3/uL (ref 4.0–10.5)
nRBC: 0 % (ref 0.0–0.2)

## 2019-07-17 LAB — BASIC METABOLIC PANEL
Anion gap: 9 (ref 5–15)
BUN: 16 mg/dL (ref 6–20)
CO2: 26 mmol/L (ref 22–32)
Calcium: 9.5 mg/dL (ref 8.9–10.3)
Chloride: 103 mmol/L (ref 98–111)
Creatinine, Ser: 1.42 mg/dL — ABNORMAL HIGH (ref 0.61–1.24)
GFR calc Af Amer: >60 mL/min (ref >60)
GFR calc non Af Amer: 57 mL/min — ABNORMAL LOW (ref >60)
Glucose, Bld: 107 mg/dL — ABNORMAL HIGH (ref 70–99)
Potassium: 4.4 mmol/L (ref 3.5–5.1)
Sodium: 138 mmol/L (ref 135–145)

## 2019-07-17 LAB — TROPONIN I (HIGH SENSITIVITY): Troponin I (High Sensitivity): 3 ng/L (ref <18)

## 2019-07-17 MED ORDER — SODIUM CHLORIDE 0.9% FLUSH: 3.0000 mL | Freq: Once | INTRAVENOUS | Status: DC

## 2019-07-17 NOTE — Therapy (Signed)
[facility] MAIN [facility] SERVICES [address] [place], [place], [address] Phone: [phone]   Fax:  [phone]  Patient Details  Name: Steven Archer MRN: [id number] Date of Birth: Aug 29, 1970 Referring Provider:  Lubertha South, MD  Encounter Date: 07/17/2019   Pt's gait appeared worse while ambulating to SLP's office, Upon sitting down, pt's wife requested SLP take pt's BP d/t sudden onset dizziness and pt becoming diaphoretic right before coming to appt.  1510 - BP 130/80; HR 91 - (per wife's report, this is very high for pt); pt continued to be diaphoretic; pt reports 6 out of 10 left chest pain; sensation of elephant sitting on his chest - pt with history of MI in 2006 with ablation 2007; this Clinical research associate provided education on positive risk factors for heart attack but pt refused further evaluation 1528 - above symptoms continued with increased confusion and acute onset of nausea  1534 - This Clinical research associate called Medical Alert for Rapid Response given pt's increased symptoms - BP now 136/90 1540 - Rapid Response team arrived and transported pt to ED for further evaluation   Demeshia Sherburne B. Dreama Saa M.S., CCC-SLP, [facility] Speech-Language Pathologist Rehabilitation Services Office [phone]    Reuel Derby 07/17/2019, 3:50 PM  [facility] MAIN [facility] SERVICES [address] [place], [place], [address] Phone: [phone]   Fax:  [phone]

## 2019-07-17 NOTE — ED Triage Notes (Signed)
Pt presents to ED via wheelchair, rapid response called while patient in PT gym. Pt c/o L sided CP at this time. Pt with hx of TBI, with noted difficulty speaking involuntary muscle movements.

## 2019-07-17 NOTE — Progress Notes (Signed)
CH attempted follow-up visit from earlier rapid response; per ED admitting staff pt. and wife left hospital.    07/17/19 2200  Clinical Encounter Type  Visited With Patient not available

## 2019-07-17 NOTE — Progress Notes (Signed)
CH paged for RR in rehab gym; pt. in therapy rm. with other medical staff attending to him; Pt. experiencing chest pain and weakness, per staff.  CH accompanied pt. and wife to ED admitting desk.  On the way wife shared pt. is angry that he is being taken for medical evaluation at [facility]; she alluded to past bad experiences w/this hospital in her family.  Pt. taken to ED triage and wife allowed to accompany him because he has difficulty communicating after a TBI.  CH will attempt follow up later today if possible.    07/17/19 1540  Clinical Encounter Type  Visited With Patient and family together  Visit Type Initial;Psychological support;Social support  Referral From Nurse  Spiritual Encounters  Spiritual Needs Emotional  Stress Factors  Patient Stress Factors Not reviewed  Family Stress Factors Loss of control;Health changes

## 2019-07-18 ENCOUNTER — Telehealth: Payer: Self-pay | Admitting: Emergency Medicine

## 2019-07-18 NOTE — Telephone Encounter (Signed)
Called patient due to lwot to inquire about condition and follow up plans. Left message.   

## 2019-07-23 ENCOUNTER — Ambulatory Visit: Payer: Medicare Other

## 2019-07-23 ENCOUNTER — Ambulatory Visit: Payer: Medicare Other | Admitting: Speech Pathology

## 2019-07-23 ENCOUNTER — Other Ambulatory Visit: Payer: Self-pay

## 2019-07-23 DIAGNOSIS — M6281 Muscle weakness (generalized): Secondary | ICD-10-CM

## 2019-07-23 DIAGNOSIS — R41841 Cognitive communication deficit: Secondary | ICD-10-CM

## 2019-07-23 DIAGNOSIS — R2681 Unsteadiness on feet: Secondary | ICD-10-CM

## 2019-07-23 NOTE — Therapy (Signed)
[place] [facility] MAIN [facility] SERVICES [address] [place], [place], [address] Phone: [phone]   Fax:  [phone]  Physical Therapy Treatment  Patient Details  Name: Steven Archer MRN: [id number] Date of Birth: 09-Oct-1970 Referring Provider (PT): Shuping, Huston Foley MD   Encounter Date: 07/23/2019   PT End of Session - 07/23/19 1610    Visit Number 4    Number of Visits 17    Date for PT Re-Evaluation 08/27/19    PT Start Time 1606    PT Stop Time 1647    PT Time Calculation (min) 41 min    Equipment Utilized During Treatment Gait belt    Activity Tolerance Patient tolerated treatment well;No increased pain    Behavior During Therapy Flat affect           Past Medical History:  Diagnosis Date  . Brain damage    Blow to head with hammer  . Coronary artery disease   . Heart murmur   . Kidney failure   . Kidney stone   . MI (myocardial infarction) (HCC)   . Sleep apnea   . TBI (traumatic brain injury) [facility])     Past Surgical History:  Procedure Laterality Date  . CORONARY ANGIOPLASTY WITH STENT PLACEMENT  2007    There were no vitals filed for this visit.   Subjective Assessment - 07/23/19 1608    Subjective Patient reports 7/10 headache pain upon arrival. Denies knee pain. He denies any falls in last few days but continues with stumbles. He also continues with dizziness/vertigo. He reports intermittent chest pain but left the ED AMA. He has not communicated with his MD regarding his chest pain. No specific concerns reported.    Pertinent History 49 yo Male s/p TBI (hit in head at work) in 2014. He was denied worker's comp and then he was unable to qualify for [employer]. Therefore he has not had any rehab since accident. He has had multiple falls with most recent being in Dec 2020. His wife was present for evaluation. She reports he has had some cognitive changes since recent fall. However they deny any physical changes;  He presents to therapy without AD. He reports that occasionally he will use a walking stick; In addition he will hold onto furniture when walking; He reports intermittent tingling/numbness in LUE and LLE; He also reports that his left leg will give out from time to time; Patient reports a history of dizziness, with feeling "drunk" when walking; He also reports some difficulty with transfers;    Limitations Standing;Walking;House hold activities    How long can you sit comfortably? 30 min;    How long can you stand comfortably? unsure    How long can you walk comfortably? about 500 feet;    Diagnostic tests CT scan obtained in January 2021 showed no acute intracranial abnormalities, had MRI done 6/22, unsure of results;    Patient Stated Goals "be able to walk better. reduce fall risk/history"- walk normal    Currently in Pain? Yes    Pain Score 7     Pain Location Head    Pain Orientation Left    Pain Descriptors / Indicators Pressure    Pain Type Chronic pain    Pain Onset More than a month ago    Pain Frequency Constant            TREATMENT   Neuromuscular Re-education  Warm up on Nustep BUE/BLE level 2-4 x  4 min during history (1 minutes unbilled); Sit to stand without UE support in // bars x 10; Sitting in chair unsupported eyes open/closed x 30s each; Sitting unsupported eyes open horizontal/vertical head turns x 30s each; Sitting unsupported eyes closed horizontal/vertical head turns x 30s each; Standing static balance with WBOS/NBOS eyes open/closed x 30s each; Standing balance WBOS/NBOS horizontal followed by vertical head turns x 30s each; Airex NBOS eyes open horizontal and vertical head turns x 30s each; Airex NBOS eyes closed x 30s; Semitandem balance without UE support one foot on 5" step and one foot on Airex pad alternating forward LE x 30s each; Lateral step-up/over from Airex pad to 5" step and down to Airex pad on other side and back x 10; NBOS Airex basketball  tosses without UE support x 5 minutes; 6" orange hurdle forward/backward step-over without UE support x 10 on each side;    Pt demonstrates excellent motivation during session today. He experiences tremors throughout session and intermittent myclonic jerking. No changes in symptoms since last therapy session. He went to the ED last week for chest pain but left AMA. He has not advised his PCP of his symptoms and therapist encouraged pt to contact his PCP. He is considerably less stable with his eyes closed in both sitting and standing requiring intermittent minA+1 for added stability. Pt encouraged to follow-up as scheduled. Pt will benefit from PT services to address deficits in strength, balance, and mobility in order to return to full function at home.                                PT Short Term Goals - 07/02/19 1000      PT SHORT TERM GOAL #1   Title Patient will be adherent to HEP at least 3x a week to improve functional strength and balance for better safety at home.    Time 4    Period Weeks    Status New    Target Date 07/30/19      PT SHORT TERM GOAL #2   Title Patient will increase BLE gross strength to 4+/5 as to improve functional strength for independent gait, increased standing tolerance and increased ADL ability.    Time 4    Period Weeks    Status New    Target Date 07/30/19             PT Long Term Goals - 07/02/19 1000      PT LONG TERM GOAL #1   Title Patient will be able to transfer sit<>Stand from a chair independently without pushing on arm rests exhibiting good safety awareness for return to PLOF.    Time 8    Period Weeks    Status New    Target Date 08/27/19      PT LONG TERM GOAL #2   Title Patient will ambulate supervision on level surface without AD with good foot clearance and no evidence of loss of balance for improved gait safety in home.    Time 8    Period Weeks    Status New    Target Date 08/27/19      PT LONG  TERM GOAL #3   Title Patient will improve FOTO score to >53/100 to indicate improved functional mobility;    Time 8    Period Weeks    Status New    Target Date 08/27/19      PT  LONG TERM GOAL #4   Title Patient (< 14 years old) will complete five times sit to stand test in < 10 seconds indicating an increased LE strength and improved balance.    Time 8    Period Weeks    Status New    Target Date 08/27/19      PT LONG TERM GOAL #5   Title Patient will increase six minute walk test distance to >1000 for progression to community ambulator and improve gait ability    Time 8    Period Weeks    Status New    Target Date 08/27/19      Additional Long Term Goals   Additional Long Term Goals Yes      PT LONG TERM GOAL #6   Title Patient will demonstrate an improved Berg Balance Score of > 50/56 as to demonstrate improved balance with ADLs such as sitting/standing and transfer balance and reduced fall risk.    Time 8    Period Weeks    Status New    Target Date 08/27/19                 Plan - 07/23/19 1611    Clinical Impression Statement Pt demonstrates excellent motivation during session today. He experiences tremors throughout session and intermittent myclonic jerking. No changes in symptoms since last therapy session. He went to the ED last week for chest pain but left AMA. He has not advised his PCP of his symptoms and therapist encouraged pt to contact his PCP. He is considerably less stable with his eyes closed in both sitting and standing requiring intermittent minA+1 for added stability. Pt encouraged to follow-up as scheduled. Pt will benefit from PT services to address deficits in strength, balance, and mobility in order to return to full function at home.    Personal Factors and Comorbidities Comorbidity 3+;Past/Current Experience;Time since onset of injury/illness/exacerbation;Education;Behavior Pattern    Comorbidities Past MI, CAD, kidney disease, sleep apnea, TBI,  frequent falls    Examination-Activity Limitations Caring for Others;Locomotion Level;Squat;Stairs;Stand;Transfers    Examination-Participation Restrictions Church;Cleaning;Community Activity;Driving;Laundry;Medication Management;Personal Finances;Shop;Volunteer;Yard Work    Stability/Clinical Decision Making Unstable/Unpredictable    Rehab Potential Fair    PT Frequency 2x / week    PT Duration 8 weeks    PT Treatment/Interventions Cryotherapy;Canalith Repostioning;Electrical Stimulation;Moist Heat;Gait training;Stair training;Functional mobility training;Therapeutic activities;Therapeutic exercise;Balance training;Neuromuscular re-education;Cognitive remediation;Patient/family education;Energy conservation;Dry needling;Vestibular;Visual/perceptual remediation/compensation    PT Next Visit Plan assess balance, initiate HEP    PT Home Exercise Plan initiated, see patient instructions;    Consulted and Agree with Plan of Care Patient           Patient will benefit from skilled therapeutic intervention in order to improve the following deficits and impairments:  Abnormal gait, Decreased balance, Decreased endurance, Decreased mobility, Difficulty walking, Impaired perceived functional ability, Dizziness, Decreased activity tolerance, Decreased coordination, Decreased safety awareness, Decreased strength, Pain  Visit Diagnosis: Muscle weakness (generalized)  Unsteadiness on feet     Problem List Patient Active Problem List   Diagnosis Date Noted  . History of renal stone 03/30/2015  . Lower abdominal pain 03/30/2015  . Gross hematuria 03/30/2015     Lynnea Maizes PT, DPT, GCS  Nevin Kozuch 07/23/2019, 5:23 PM  [place] [facility] MAIN [facility] SERVICES [address] [place], [place], [address] Phone: [phone]   Fax:  [phone]  Name: CAYDE HELD MRN: [id number] Date of Birth: [birth year]

## 2019-07-24 NOTE — Therapy (Signed)
[place] MAIN [facility] SERVICES [address] [place], [place], [address] Phone: [phone]   Fax:  [phone]  Speech Language Pathology Treatment DISCHARGE SUMMARY  Patient Details  Name: Steven Archer MRN: [id number] Date of Birth: October 26, 1970 Referring Provider (SLP): Rockney Ghee Shuping   Encounter Date: 07/23/2019   End of Session - 07/24/19 1048    Visit Number 5    Authorization Type Medicare    Authorization - Visit Number 5    SLP Start Time 1500    SLP Stop Time  0762    SLP Time Calculation (min) 30 min    Activity Tolerance Patient tolerated treatment well           Past Medical History:  Diagnosis Date  . Brain damage    Blow to head with hammer  . Coronary artery disease   . Heart murmur   . Kidney failure   . Kidney stone   . MI (myocardial infarction) ([place])   . Sleep apnea   . TBI (traumatic brain injury) [facility])     Past Surgical History:  Procedure Laterality Date  . CORONARY ANGIOPLASTY WITH STENT PLACEMENT  2007    There were no vitals filed for this visit.   Subjective Assessment - 07/24/19 1046    Subjective pt reserved    Patient is accompained by: Family member    Currently in Pain? No/denies                 ADULT SLP TREATMENT - 07/24/19 0001      General Information   Behavior/Cognition Alert;Cooperative;Pleasant mood    HPI Steven Archer is a 49 y.o. male with a past medical history of TBI (2014) with worsening mood, balance, and cognitive changes after a fall in December 2020. CT head in Jan 2021 was negative for intracranial pathology. Most significant concern today worsening cognitive issues, mood outbursts, irritability and frequent falls that appear to worsen after a fall in December 2020. Patient struck his head in February 2014 after he hit his head against a beam at work. He is unsure about LOC. He does not remember the injury. Wife remembers him coming home with a large area  of swelling over his head. At that time, she did ask him about the events leading to his injury, he was able to recall pieces of the events in 2014. Onset of symptoms started in March 2014 including headaches, dizziness, cognitive changes, altered mental status, frequent falls. He only had a few sessions of therapy at time of initial injury. Most recent MRI (06/25/2019) revealed scattered periventricular and deep white matter T2/FLAIR hyperintensities, which can be seen in chronic small vessel ischemic disease. Otherwise, unremarkable MRI of the brain.       Treatment Provided   Treatment provided Cognitive-Linquistic      Cognitive-Linquistic Treatment   Treatment focused on Cognition    Skilled Treatment Education on possible apps to use for cognitive skills      Assessment / Recommendations / Plan   Plan Other (Comment)   Pt not interested     Progression Toward Goals   Progression toward goals Not progressing toward goals (comment)   pt not interested in ST services           SLP Education - 07/24/19 1048    Education Details cognitive training    Person(s) Educated Patient;Spouse    Methods Explanation    Comprehension Verbalized understanding;Returned demonstration  SLP Long Term Goals - 07/24/19 1051      SLP LONG TERM GOAL #1   Title Pt will improve recall of information related to daily activities to increase safety with medication administration and participation in daily activities.    Baseline moderate impairments    Status Partially Met            Plan - 07/24/19 1049    Clinical Impression Statement Pt and his wife state that they don't understand why ST services were recommended. They state that their lives have adapted to pt's brain injury and his personal preferences. Pt also states that he is not interested in implementing any memory strategies within his daily life. At this time, all educaiton has been completed, ST services will be  discontinued.    SLP Home Exercise Plan given    Consulted and Agree with Plan of Care Patient;Family member/caregiver    Family Member Consulted wife            Problem List Patient Active Problem List   Diagnosis Date Noted  . History of renal stone 03/30/2015  . Lower abdominal pain 03/30/2015  . Gross hematuria 03/30/2015   Tesneem Dufrane B. Rutherford Nail M.S., CCC-SLP, [place] Pathologist Rehabilitation Services Office [phone]   Stormy Fabian 07/24/2019, 10:52 AM  [place] MAIN [facility] SERVICES [address] [place], [place], [address] Phone: [phone]   Fax:  [phone]   Name: Steven Archer MRN: [id number] Date of Birth: 1970-01-21

## 2019-07-25 ENCOUNTER — Ambulatory Visit: Payer: Medicare Other | Admitting: Speech Pathology

## 2019-07-29 ENCOUNTER — Other Ambulatory Visit: Payer: Self-pay

## 2019-07-29 ENCOUNTER — Ambulatory Visit: Payer: Medicare Other

## 2019-07-29 DIAGNOSIS — M6281 Muscle weakness (generalized): Secondary | ICD-10-CM | POA: Diagnosis not present

## 2019-07-29 DIAGNOSIS — R2681 Unsteadiness on feet: Secondary | ICD-10-CM

## 2019-07-29 NOTE — Therapy (Signed)
[place] [facility] [facility] MAIN [facility] SERVICES [address] [place], [place], [address] Phone: [phone]   Fax:  [phone]  Physical Therapy Treatment  Patient Details  Name: Steven Archer MRN: [id number] Date of Birth: Dec 12, 1970 Referring Provider (PT): Shuping, Huston Foley MD   Encounter Date: 07/29/2019   PT End of Session - 07/29/19 1121    Visit Number 5    Number of Visits 17    Date for PT Re-Evaluation 08/27/19    PT Start Time 1118    PT Stop Time 1145    PT Time Calculation (min) 27 min    Equipment Utilized During Treatment Gait belt    Activity Tolerance Patient tolerated treatment well;No increased pain    Behavior During Therapy Flat affect           Past Medical History:  Diagnosis Date  . Brain damage    Blow to head with hammer  . Coronary artery disease   . Heart murmur   . Kidney failure   . Kidney stone   . MI (myocardial infarction) (HCC)   . Sleep apnea   . TBI (traumatic brain injury) [facility])     Past Surgical History:  Procedure Laterality Date  . CORONARY ANGIOPLASTY WITH STENT PLACEMENT  2007    There were no vitals filed for this visit.   Subjective Assessment - 07/29/19 1119    Subjective Patient reports 8/10 headache pain upon arrival. He denies any falls in last few days. No change in patient's dizziness. Pt reports continued chest pain but he still has not communicated with his MD. No specific concerns reported.    Pertinent History 49 yo Male s/p TBI (hit in head at work) in 2014. He was denied worker's comp and then he was unable to qualify for [employer]. Therefore he has not had any rehab since accident. He has had multiple falls with most recent being in Dec 2020. His wife was present for evaluation. She reports he has had some cognitive changes since recent fall. However they deny any physical changes; He presents to therapy without AD. He reports that occasionally he will use a walking stick;  In addition he will hold onto furniture when walking; He reports intermittent tingling/numbness in LUE and LLE; He also reports that his left leg will give out from time to time; Patient reports a history of dizziness, with feeling "drunk" when walking; He also reports some difficulty with transfers;    Limitations Standing;Walking;House hold activities    How long can you sit comfortably? 30 min;    How long can you stand comfortably? unsure    How long can you walk comfortably? about 500 feet;    Diagnostic tests CT scan obtained in January 2021 showed no acute intracranial abnormalities, had MRI done 6/22, unsure of results;    Patient Stated Goals "be able to walk better. reduce fall risk/history"- walk normal    Currently in Pain? Yes    Pain Score 8     Pain Location Head    Pain Orientation Left    Pain Descriptors / Indicators Pressure    Pain Type Chronic pain    Pain Onset More than a month ago    Pain Frequency Constant               TREATMENT   Neuromuscular Re-education  Warm up on Nustep BUE/BLE level 2-4 x 4 min during history (3 minutes unbilled); Sit to stand without  UE support in // bars with Airex under feet 2 x 10; NBOS dart board tosses without UE support x 5 minutes; Airex alternating 6" step taps without UE support x 10 BLE; Airex alternating 6" step ups with Airex pad on top of step x 5 BLE; Airex cone taps with therapist calling out color and laterality x multiple bouts on each side; Airex NBOS eyes closed x 60s   Pt demonstrates excellent motivation during session today. He arrived very late to session so time was limited. He experiences tremors throughout session and intermittent myclonic jerking. No changes in symptoms since last therapy session and pt denies any improvement in his dizziness. He has not communicated with his MD regarding ongoing chest pain and therapist once again encouraged pt to reach out to his PCP.  His balance appears slightly  improved today during session and wife also reports that she believes his balance is improving. Pt encouraged to follow-up as scheduled. Pt will benefit from PT services to address deficits in strength, balance, and mobility in order to return to full function at home.                             PT Short Term Goals - 07/02/19 1000      PT SHORT TERM GOAL #1   Title Patient will be adherent to HEP at least 3x a week to improve functional strength and balance for better safety at home.    Time 4    Period Weeks    Status New    Target Date 07/30/19      PT SHORT TERM GOAL #2   Title Patient will increase BLE gross strength to 4+/5 as to improve functional strength for independent gait, increased standing tolerance and increased ADL ability.    Time 4    Period Weeks    Status New    Target Date 07/30/19             PT Long Term Goals - 07/02/19 1000      PT LONG TERM GOAL #1   Title Patient will be able to transfer sit<>Stand from a chair independently without pushing on arm rests exhibiting good safety awareness for return to PLOF.    Time 8    Period Weeks    Status New    Target Date 08/27/19      PT LONG TERM GOAL #2   Title Patient will ambulate supervision on level surface without AD with good foot clearance and no evidence of loss of balance for improved gait safety in home.    Time 8    Period Weeks    Status New    Target Date 08/27/19      PT LONG TERM GOAL #3   Title Patient will improve FOTO score to >53/100 to indicate improved functional mobility;    Time 8    Period Weeks    Status New    Target Date 08/27/19      PT LONG TERM GOAL #4   Title Patient (< 21 years old) will complete five times sit to stand test in < 10 seconds indicating an increased LE strength and improved balance.    Time 8    Period Weeks    Status New    Target Date 08/27/19      PT LONG TERM GOAL #5   Title Patient will increase six minute walk test  distance to >  1000 for progression to community ambulator and improve gait ability    Time 8    Period Weeks    Status New    Target Date 08/27/19      Additional Long Term Goals   Additional Long Term Goals Yes      PT LONG TERM GOAL #6   Title Patient will demonstrate an improved Berg Balance Score of > 50/56 as to demonstrate improved balance with ADLs such as sitting/standing and transfer balance and reduced fall risk.    Time 8    Period Weeks    Status New    Target Date 08/27/19                 Plan - 07/29/19 1121    Clinical Impression Statement Pt demonstrates excellent motivation during session today. He arrived very late to session so time was limited. He experiences tremors throughout session and intermittent myclonic jerking. No changes in symptoms since last therapy session and pt denies any improvement in his dizziness. He has not communicated with his MD regarding ongoing chest pain and therapist once again encouraged pt to reach out to his PCP.  His balance appears slightly improved today during session and wife also reports that she believes his balance is improving. Pt encouraged to follow-up as scheduled. Pt will benefit from PT services to address deficits in strength, balance, and mobility in order to return to full function at home.    Personal Factors and Comorbidities Comorbidity 3+;Past/Current Experience;Time since onset of injury/illness/exacerbation;Education;Behavior Pattern    Comorbidities Past MI, CAD, kidney disease, sleep apnea, TBI, frequent falls    Examination-Activity Limitations Caring for Others;Locomotion Level;Squat;Stairs;Stand;Transfers    Examination-Participation Restrictions Church;Cleaning;Community Activity;Driving;Laundry;Medication Management;Personal Finances;Shop;Volunteer;Yard Work    Stability/Clinical Decision Making Unstable/Unpredictable    Rehab Potential Fair    PT Frequency 2x / week    PT Duration 8 weeks    PT  Treatment/Interventions Cryotherapy;Canalith Repostioning;Electrical Stimulation;Moist Heat;Gait training;Stair training;Functional mobility training;Therapeutic activities;Therapeutic exercise;Balance training;Neuromuscular re-education;Cognitive remediation;Patient/family education;Energy conservation;Dry needling;Vestibular;Visual/perceptual remediation/compensation    PT Next Visit Plan assess balance, initiate HEP    PT Home Exercise Plan initiated, see patient instructions;    Consulted and Agree with Plan of Care Patient           Patient will benefit from skilled therapeutic intervention in order to improve the following deficits and impairments:  Abnormal gait, Decreased balance, Decreased endurance, Decreased mobility, Difficulty walking, Impaired perceived functional ability, Dizziness, Decreased activity tolerance, Decreased coordination, Decreased safety awareness, Decreased strength, Pain  Visit Diagnosis: Muscle weakness (generalized)  Unsteadiness on feet     Problem List Patient Active Problem List   Diagnosis Date Noted  . History of renal stone 03/30/2015  . Lower abdominal pain 03/30/2015  . Gross hematuria 03/30/2015   Lynnea Maizes PT, DPT, GCS + Zeynep Fantroy 07/29/2019, 2:30 PM  [place] [facility] MAIN [facility] SERVICES [address] [place], [place], [address] Phone: [phone]   Fax:  [phone]  Name: CORIN TILLY MRN: [id number] Date of Birth: 1970-06-20

## 2019-07-31 ENCOUNTER — Other Ambulatory Visit: Payer: Self-pay

## 2019-07-31 ENCOUNTER — Ambulatory Visit: Payer: Medicare Other

## 2019-07-31 ENCOUNTER — Ambulatory Visit: Payer: Medicare Other | Admitting: Speech Pathology

## 2019-07-31 DIAGNOSIS — M6281 Muscle weakness (generalized): Secondary | ICD-10-CM | POA: Diagnosis not present

## 2019-07-31 DIAGNOSIS — R2681 Unsteadiness on feet: Secondary | ICD-10-CM
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
## 2019-08-06 ENCOUNTER — Ambulatory Visit: Payer: Medicare Other

## 2019-08-08 ENCOUNTER — Ambulatory Visit: Payer: Medicare Other

## 2019-08-09 ENCOUNTER — Ambulatory Visit: Payer: Medicare Other

## 2019-08-09 ENCOUNTER — Encounter: Payer: Medicare Other | Admitting: Speech Pathology

## 2019-08-12 ENCOUNTER — Other Ambulatory Visit: Payer: Self-pay

## 2019-08-12 ENCOUNTER — Ambulatory Visit: Payer: Medicare Other | Attending: Physical Medicine and Rehabilitation

## 2019-08-12 DIAGNOSIS — R41841 Cognitive communication deficit: Secondary | ICD-10-CM | POA: Diagnosis present

## 2019-08-12 DIAGNOSIS — R2681 Unsteadiness on feet: Secondary | ICD-10-CM | POA: Diagnosis present

## 2019-08-12 DIAGNOSIS — M6281 Muscle weakness (generalized): Secondary | ICD-10-CM | POA: Insufficient documentation

## 2019-08-12 DIAGNOSIS — Z9181 History of falling: Secondary | ICD-10-CM | POA: Insufficient documentation
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
## 2019-08-14 ENCOUNTER — Other Ambulatory Visit: Payer: Self-pay

## 2019-08-14 ENCOUNTER — Ambulatory Visit: Payer: Medicare Other

## 2019-08-14 DIAGNOSIS — M6281 Muscle weakness (generalized): Secondary | ICD-10-CM

## 2019-08-14 DIAGNOSIS — R2681 Unsteadiness on feet: Secondary | ICD-10-CM
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
## 2019-08-19 ENCOUNTER — Other Ambulatory Visit: Payer: Self-pay

## 2019-08-19 ENCOUNTER — Encounter: Payer: Self-pay | Admitting: Physical Therapy

## 2019-08-19 ENCOUNTER — Ambulatory Visit: Payer: Medicare Other | Admitting: Physical Therapy

## 2019-08-19 DIAGNOSIS — M6281 Muscle weakness (generalized): Secondary | ICD-10-CM | POA: Diagnosis not present

## 2019-08-19 DIAGNOSIS — Z9181 History of falling: Secondary | ICD-10-CM

## 2019-08-19 DIAGNOSIS — R2681 Unsteadiness on feet: Secondary | ICD-10-CM

## 2019-08-19 DIAGNOSIS — R41841 Cognitive communication deficit: Secondary | ICD-10-CM
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
## 2019-08-20 ENCOUNTER — Encounter: Payer: Medicare Other | Admitting: Speech Pathology

## 2019-08-21 ENCOUNTER — Other Ambulatory Visit: Payer: Self-pay

## 2019-08-21 ENCOUNTER — Ambulatory Visit: Payer: Medicare Other

## 2019-08-21 DIAGNOSIS — M6281 Muscle weakness (generalized): Secondary | ICD-10-CM | POA: Diagnosis not present

## 2019-08-21 DIAGNOSIS — Z9181 History of falling: Secondary | ICD-10-CM

## 2019-08-21 DIAGNOSIS — R2681 Unsteadiness on feet: Secondary | ICD-10-CM
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
## 2019-08-26 ENCOUNTER — Ambulatory Visit: Payer: Medicare Other | Admitting: Physical Therapy

## 2019-08-26 ENCOUNTER — Encounter: Payer: Self-pay | Admitting: Physical Therapy

## 2019-08-26 ENCOUNTER — Other Ambulatory Visit: Payer: Self-pay

## 2019-08-26 DIAGNOSIS — R2681 Unsteadiness on feet: Secondary | ICD-10-CM

## 2019-08-26 DIAGNOSIS — M6281 Muscle weakness (generalized): Secondary | ICD-10-CM

## 2019-08-26 DIAGNOSIS — Z9181 History of falling: Secondary | ICD-10-CM
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
## 2019-08-28 ENCOUNTER — Other Ambulatory Visit: Payer: Self-pay

## 2019-08-28 ENCOUNTER — Ambulatory Visit: Payer: Medicare Other

## 2019-08-28 DIAGNOSIS — M6281 Muscle weakness (generalized): Secondary | ICD-10-CM

## 2019-08-28 DIAGNOSIS — Z9181 History of falling: Secondary | ICD-10-CM

## 2019-08-28 DIAGNOSIS — R2681 Unsteadiness on feet: Secondary | ICD-10-CM
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
## 2019-08-30 ENCOUNTER — Ambulatory Visit: Payer: Medicare Other

## 2019-09-02 ENCOUNTER — Ambulatory Visit: Payer: Medicare Other

## 2019-09-04 ENCOUNTER — Other Ambulatory Visit: Payer: Self-pay

## 2019-09-04 ENCOUNTER — Ambulatory Visit: Payer: Medicare Other | Attending: Physical Medicine and Rehabilitation

## 2019-09-04 DIAGNOSIS — M6281 Muscle weakness (generalized): Secondary | ICD-10-CM | POA: Insufficient documentation

## 2019-09-04 DIAGNOSIS — R2681 Unsteadiness on feet: Secondary | ICD-10-CM | POA: Diagnosis not present
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
## 2019-09-10 ENCOUNTER — Ambulatory Visit: Payer: Medicare Other

## 2019-09-11 ENCOUNTER — Ambulatory Visit: Payer: Medicare Other

## 2019-09-11 ENCOUNTER — Other Ambulatory Visit: Payer: Self-pay

## 2019-09-11 DIAGNOSIS — R2681 Unsteadiness on feet: Secondary | ICD-10-CM

## 2019-09-11 DIAGNOSIS — M6281 Muscle weakness (generalized): Secondary | ICD-10-CM
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
## 2019-09-12 ENCOUNTER — Other Ambulatory Visit: Payer: Self-pay

## 2019-09-12 ENCOUNTER — Ambulatory Visit: Payer: Medicare Other

## 2019-09-12 DIAGNOSIS — M6281 Muscle weakness (generalized): Secondary | ICD-10-CM

## 2019-09-12 DIAGNOSIS — R2681 Unsteadiness on feet: Secondary | ICD-10-CM
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
## 2019-09-16 ENCOUNTER — Ambulatory Visit: Payer: Medicare Other

## 2019-09-16 ENCOUNTER — Other Ambulatory Visit: Payer: Self-pay

## 2019-09-16 DIAGNOSIS — M6281 Muscle weakness (generalized): Secondary | ICD-10-CM

## 2019-09-16 DIAGNOSIS — R2681 Unsteadiness on feet: Secondary | ICD-10-CM | POA: Diagnosis not present
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
## 2019-09-18 ENCOUNTER — Ambulatory Visit: Payer: Medicare Other

## 2019-09-18 ENCOUNTER — Other Ambulatory Visit: Payer: Self-pay

## 2019-09-18 DIAGNOSIS — M6281 Muscle weakness (generalized): Secondary | ICD-10-CM

## 2019-09-18 DIAGNOSIS — R2681 Unsteadiness on feet: Secondary | ICD-10-CM
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
## 2019-09-23 ENCOUNTER — Ambulatory Visit: Payer: Medicare Other

## 2019-09-25 ENCOUNTER — Ambulatory Visit: Payer: Medicare Other

## 2019-09-30 ENCOUNTER — Other Ambulatory Visit: Payer: Self-pay

## 2019-09-30 ENCOUNTER — Ambulatory Visit: Payer: Medicare Other

## 2019-09-30 DIAGNOSIS — R2681 Unsteadiness on feet: Secondary | ICD-10-CM | POA: Diagnosis not present

## 2019-09-30 DIAGNOSIS — M6281 Muscle weakness (generalized): Secondary | ICD-10-CM
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
## 2019-10-02 ENCOUNTER — Other Ambulatory Visit: Payer: Self-pay

## 2019-10-02 ENCOUNTER — Ambulatory Visit: Payer: Medicare Other

## 2019-10-02 DIAGNOSIS — R2681 Unsteadiness on feet: Secondary | ICD-10-CM | POA: Diagnosis not present

## 2019-10-02 DIAGNOSIS — M6281 Muscle weakness (generalized): Secondary | ICD-10-CM
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
## 2019-10-07 ENCOUNTER — Ambulatory Visit: Payer: Medicare Other

## 2019-10-09 ENCOUNTER — Ambulatory Visit: Payer: Medicare Other

## 2019-10-14 ENCOUNTER — Ambulatory Visit: Payer: Medicare Other

## 2019-10-16 ENCOUNTER — Ambulatory Visit: Payer: Medicare Other

## 2019-10-21 ENCOUNTER — Ambulatory Visit: Payer: Medicare Other

## 2019-10-23 ENCOUNTER — Ambulatory Visit: Payer: Medicare Other

## 2019-10-23 ENCOUNTER — Encounter: Payer: Medicare Other | Admitting: Occupational Therapy

## 2019-10-28 ENCOUNTER — Ambulatory Visit: Payer: Medicare Other

## 2019-10-30 ENCOUNTER — Other Ambulatory Visit: Payer: Self-pay

## 2019-10-30 ENCOUNTER — Ambulatory Visit: Payer: Medicare Other | Attending: Physical Medicine and Rehabilitation

## 2019-10-30 DIAGNOSIS — R2681 Unsteadiness on feet: Secondary | ICD-10-CM | POA: Diagnosis not present

## 2019-10-30 DIAGNOSIS — M6281 Muscle weakness (generalized): Secondary | ICD-10-CM

## 2019-10-30 NOTE — Patient Instructions (Signed)
Access Code: [email or web address] URL: [email or web address] Date: 10/30/2019 Prepared by: Ria Comment  Exercises Heel rises with counter support - 1 x daily - 7 x weekly - 2 sets - 10 reps Standing Hip Abduction with Counter Support - 1 x daily - 7 x weekly - 2 sets - 10 reps Sit to Stand without Arm Support - 1 x daily - 7 x weekly - 2 sets - 10 reps Side to Side Weight Shift with Counter Support - 1 x daily - 7 x weekly - 2 sets - 10 reps Standing Single Leg Stance with Counter Support - 1 x daily - 7 x weekly - 3 x 30s on each foot hold
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
## 2019-11-04 ENCOUNTER — Ambulatory Visit: Payer: Medicare Other

## 2019-11-06 ENCOUNTER — Ambulatory Visit: Payer: Medicare Other

## 2019-11-11 ENCOUNTER — Ambulatory Visit: Payer: Medicare Other

## 2019-11-13 ENCOUNTER — Ambulatory Visit: Payer: Medicare Other

## 2019-11-18 ENCOUNTER — Ambulatory Visit: Payer: Medicare Other

## 2019-11-20 ENCOUNTER — Ambulatory Visit: Payer: Medicare Other

## 2019-11-25 ENCOUNTER — Ambulatory Visit: Payer: Medicare Other

## 2019-11-27 ENCOUNTER — Ambulatory Visit: Payer: Medicare Other

## 2019-12-02 ENCOUNTER — Ambulatory Visit: Payer: Medicare Other

## 2019-12-04 ENCOUNTER — Ambulatory Visit: Payer: Medicare Other

## 2019-12-09 ENCOUNTER — Ambulatory Visit: Payer: Medicare Other

## 2019-12-11 ENCOUNTER — Ambulatory Visit: Payer: Medicare Other

## 2019-12-16 ENCOUNTER — Ambulatory Visit: Payer: Medicare Other

## 2019-12-18 ENCOUNTER — Ambulatory Visit: Payer: Medicare Other

## 2019-12-23 ENCOUNTER — Ambulatory Visit: Payer: Medicare Other

## 2019-12-25 ENCOUNTER — Ambulatory Visit: Payer: Medicare Other

## 2019-12-30 ENCOUNTER — Ambulatory Visit: Payer: Medicare Other

## 2020-01-01 ENCOUNTER — Ambulatory Visit: Payer: Medicare Other

## 2021-05-28 ENCOUNTER — Ambulatory Visit: Payer: Medicare Other | Admitting: Family

## 2021-07-19 ENCOUNTER — Ambulatory Visit: Payer: Medicare Other | Admitting: Family

## 2021-07-20 ENCOUNTER — Encounter: Payer: Self-pay | Admitting: Family

## 2021-07-20 ENCOUNTER — Ambulatory Visit (INDEPENDENT_AMBULATORY_CARE_PROVIDER_SITE_OTHER): Payer: Medicare Other | Admitting: Family

## 2021-07-20 VITALS — BP 98/62 | HR 70 | Temp 98.3°F | Resp 16 | Ht 70.0 in | Wt 150.4 lb

## 2021-07-20 DIAGNOSIS — D582 Other hemoglobinopathies: Secondary | ICD-10-CM

## 2021-07-20 DIAGNOSIS — Z0000 Encounter for general adult medical examination without abnormal findings: Secondary | ICD-10-CM | POA: Insufficient documentation

## 2021-07-20 DIAGNOSIS — Z955 Presence of coronary angioplasty implant and graft: Secondary | ICD-10-CM

## 2021-07-20 DIAGNOSIS — G8929 Other chronic pain: Secondary | ICD-10-CM

## 2021-07-20 DIAGNOSIS — M5481 Occipital neuralgia: Secondary | ICD-10-CM | POA: Diagnosis not present

## 2021-07-20 DIAGNOSIS — G2581 Restless legs syndrome: Secondary | ICD-10-CM | POA: Insufficient documentation

## 2021-07-20 DIAGNOSIS — Z13220 Encounter for screening for lipoid disorders: Secondary | ICD-10-CM

## 2021-07-20 DIAGNOSIS — S069X9A Unspecified intracranial injury with loss of consciousness of unspecified duration, initial encounter: Secondary | ICD-10-CM | POA: Insufficient documentation

## 2021-07-20 DIAGNOSIS — G4700 Insomnia, unspecified: Secondary | ICD-10-CM

## 2021-07-20 DIAGNOSIS — Z9181 History of falling: Secondary | ICD-10-CM

## 2021-07-20 DIAGNOSIS — I252 Old myocardial infarction: Secondary | ICD-10-CM

## 2021-07-20 DIAGNOSIS — I2510 Atherosclerotic heart disease of native coronary artery without angina pectoris: Secondary | ICD-10-CM | POA: Insufficient documentation

## 2021-07-20 DIAGNOSIS — G4730 Sleep apnea, unspecified: Secondary | ICD-10-CM

## 2021-07-20 DIAGNOSIS — R5383 Other fatigue: Secondary | ICD-10-CM | POA: Diagnosis not present

## 2021-07-20 DIAGNOSIS — R739 Hyperglycemia, unspecified: Secondary | ICD-10-CM

## 2021-07-20 MED ORDER — ASPIRIN 81 MG PO TBEC: 81.0000 mg | DELAYED_RELEASE_TABLET | Freq: Every day | ORAL | 1 refills | Status: AC

## 2021-07-20 NOTE — Assessment & Plan Note (Signed)
Ordering lipid panel highly recommend daily statin pt declines Recommend more compliance with daily asa 81 mg pt will try
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
## 2021-07-20 NOTE — Assessment & Plan Note (Signed)
Cont f/u with neurology

## 2021-07-20 NOTE — Assessment & Plan Note (Signed)
Continue lyrica and requip per neurology

## 2021-07-20 NOTE — Assessment & Plan Note (Signed)
Refer again for sleep study > 51 y/o Pt with constant fatigue eval need for cpap Not a candidate prior

## 2021-07-20 NOTE — Assessment & Plan Note (Signed)
Continue ongoing f/u with pain management

## 2021-07-20 NOTE — Assessment & Plan Note (Signed)
Ordering lipid panel today pending results work on low chol diet

## 2021-07-20 NOTE — Assessment & Plan Note (Signed)
Continue f/u with neurology. 

## 2021-07-20 NOTE — Assessment & Plan Note (Signed)
Lipid panel ordered pending results.   

## 2021-07-20 NOTE — Assessment & Plan Note (Signed)
Order a1c  Work on diabetic diet

## 2021-07-20 NOTE — Assessment & Plan Note (Signed)
Repeat hgn  Maybe because pt is a smoker

## 2021-07-20 NOTE — Patient Instructions (Signed)
A referral was placed today for a sleep study.  Please let us know if you have not heard back within 2 weeks about the referral.  Welcome to our clinic, I am happy to have you as my new patient. I am excited to continue on this healthcare journey with you.  Stop by the lab prior to leaving today. I will notify you of your results once received.   Please keep in mind Any my chart messages you send have up to a three business day turnaround for a response.  Phone calls may take up to a one full business day turnaround for a  response.   If you need a medication refill I recommend you request it through the pharmacy as this is easiest for [place] rather than sending a message and or phone call.   Due to recent changes in healthcare laws, you may see results of your imaging and/or laboratory studies on MyChart before I have had a chance to review them.  I understand that in some cases there may be results that are confusing or concerning to you. Please understand that not all results are received at the same time and often I may need to interpret multiple results in order to provide you with the best plan of care or course of treatment. Therefore, I ask that you please give me 2 business days to thoroughly review all your results before contacting my office for clarification. Should we see a critical lab result, you will be contacted sooner.   It was a pleasure seeing you today! Please do not hesitate to reach out with any questions and or concerns.  Regards,   Mort Sawyers FNP-C

## 2021-07-20 NOTE — Assessment & Plan Note (Signed)
Sleep apnea referral placed for eval Lab work up in place pending results

## 2021-07-20 NOTE — Assessment & Plan Note (Signed)
Recommend consult with cardiology, wife and pt state they go to cardiology clinic prn with [facility]

## 2021-07-23 ENCOUNTER — Other Ambulatory Visit (INDEPENDENT_AMBULATORY_CARE_PROVIDER_SITE_OTHER): Payer: Medicare Other

## 2021-07-23 DIAGNOSIS — R739 Hyperglycemia, unspecified: Secondary | ICD-10-CM | POA: Diagnosis not present

## 2021-07-23 DIAGNOSIS — G2581 Restless legs syndrome: Secondary | ICD-10-CM | POA: Diagnosis not present

## 2021-07-23 DIAGNOSIS — R5383 Other fatigue: Secondary | ICD-10-CM

## 2021-07-23 DIAGNOSIS — Z13220 Encounter for screening for lipoid disorders: Secondary | ICD-10-CM

## 2021-07-23 LAB — CBC WITH DIFFERENTIAL/PLATELET
Basophils Absolute: 0 10*3/uL (ref 0.0–0.1)
Basophils Relative: 0.3 % (ref 0.0–3.0)
Eosinophils Absolute: 0.1 10*3/uL (ref 0.0–0.7)
Eosinophils Relative: 1.4 % (ref 0.0–5.0)
HCT: 48.3 % (ref 39.0–52.0)
Hemoglobin: 16.1 g/dL (ref 13.0–17.0)
Lymphocytes Relative: 42.4 % (ref 12.0–46.0)
Lymphs Abs: 3.5 10*3/uL (ref 0.7–4.0)
MCHC: 33.3 g/dL (ref 30.0–36.0)
MCV: 97.6 fl (ref 78.0–100.0)
Monocytes Absolute: 0.5 10*3/uL (ref 0.1–1.0)
Monocytes Relative: 6.4 % (ref 3.0–12.0)
Neutro Abs: 4.1 10*3/uL (ref 1.4–7.7)
Neutrophils Relative %: 49.5 % (ref 43.0–77.0)
Platelets: 220 10*3/uL (ref 150.0–400.0)
RBC: 4.95 Mil/uL (ref 4.22–5.81)
RDW: 14.2 % (ref 11.5–15.5)
WBC: 8.4 10*3/uL (ref 4.0–10.5)

## 2021-07-23 LAB — HEMOGLOBIN A1C: Hgb A1c MFr Bld: 5.6 % (ref 4.6–6.5)

## 2021-07-23 LAB — COMPREHENSIVE METABOLIC PANEL
ALT: 16 U/L (ref 0–53)
AST: 19 U/L (ref 0–37)
Albumin: 4.1 g/dL (ref 3.5–5.2)
Alkaline Phosphatase: 64 U/L (ref 39–117)
BUN: 13 mg/dL (ref 6–23)
CO2: 29 mEq/L (ref 19–32)
Calcium: 9.3 mg/dL (ref 8.4–10.5)
Chloride: 105 mEq/L (ref 96–112)
Creatinine, Ser: 1.32 mg/dL (ref 0.40–1.50)
GFR: 62.23 mL/min (ref >60.00)
Glucose, Bld: 101 mg/dL — ABNORMAL HIGH (ref 70–99)
Potassium: 4.4 mEq/L (ref 3.5–5.1)
Sodium: 141 mEq/L (ref 135–145)
Total Bilirubin: 0.9 mg/dL (ref 0.2–1.2)
Total Protein: 5.9 g/dL — ABNORMAL LOW (ref 6.0–8.3)

## 2021-07-23 LAB — LIPID PANEL
Cholesterol: 168 mg/dL (ref 0–200)
HDL: 34.6 mg/dL — ABNORMAL LOW (ref >39.00)
LDL Cholesterol: 116 mg/dL — ABNORMAL HIGH (ref 0–99)
NonHDL: 133.05
Total CHOL/HDL Ratio: 5
Triglycerides: 84 mg/dL (ref 0.0–149.0)
VLDL: 16.8 mg/dL (ref 0.0–40.0)

## 2021-07-23 LAB — B12 AND FOLATE PANEL
Folate: 10.6 ng/mL (ref >5.9)
Vitamin B-12: 334 pg/mL (ref 211–911)

## 2021-07-26 NOTE — Progress Notes (Signed)
LDL 116 goal is less than 70 work on a low chol diet  Protein on lower end work on increasing protein in diet Not prediabetic diet B12 on lower end of normal start otc vit b12 1000 mcg once daily

## 2022-05-01 IMAGING — CR DG CHEST 2V
1 series · 2 of 2 positions shown · non-contrast
Comparison: 01/23/2018

CLINICAL DATA: Chest pain

EXAM:
CHEST - 2 VIEW

[Series 1: dg chest 2 view · 0.14mm/px · 2 of 2 slices shown]
[im 1/2]
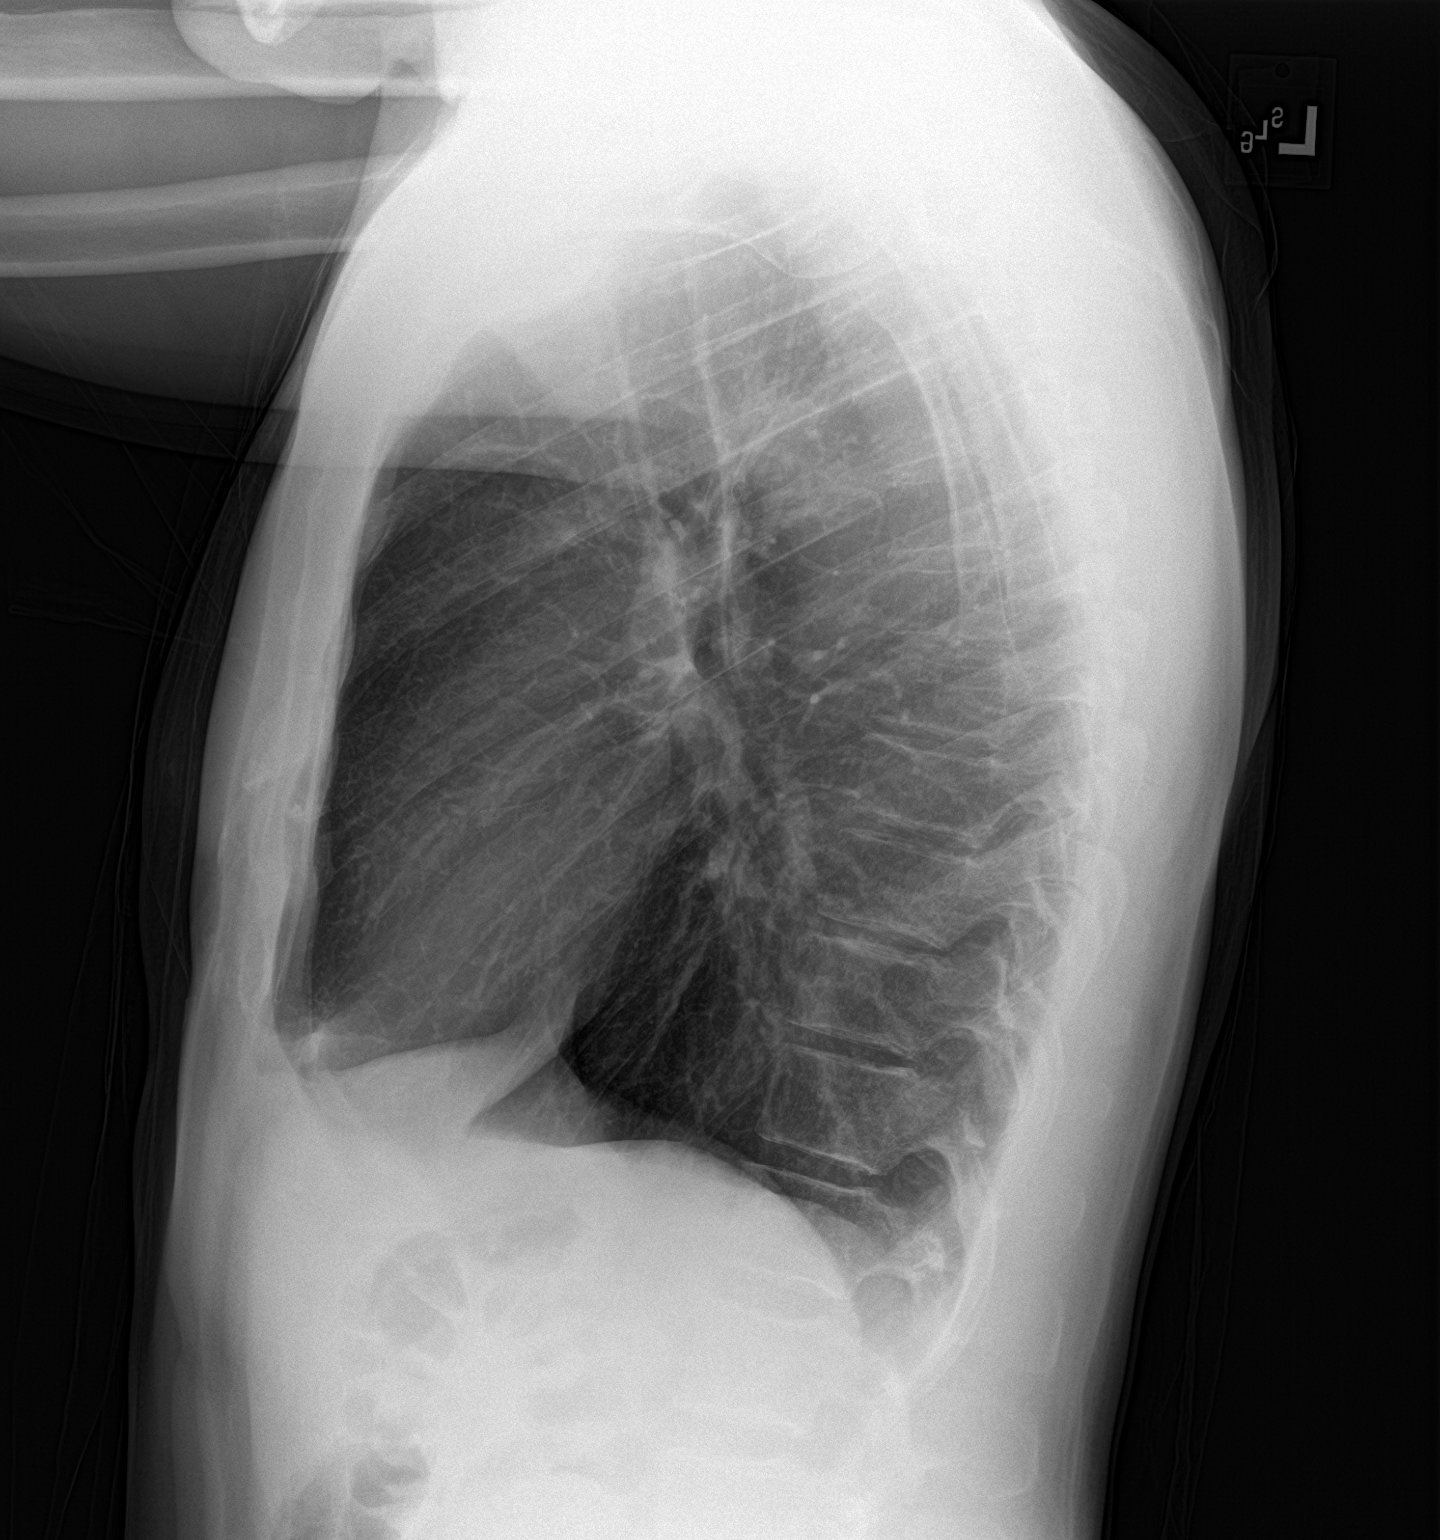
[im 2/2]
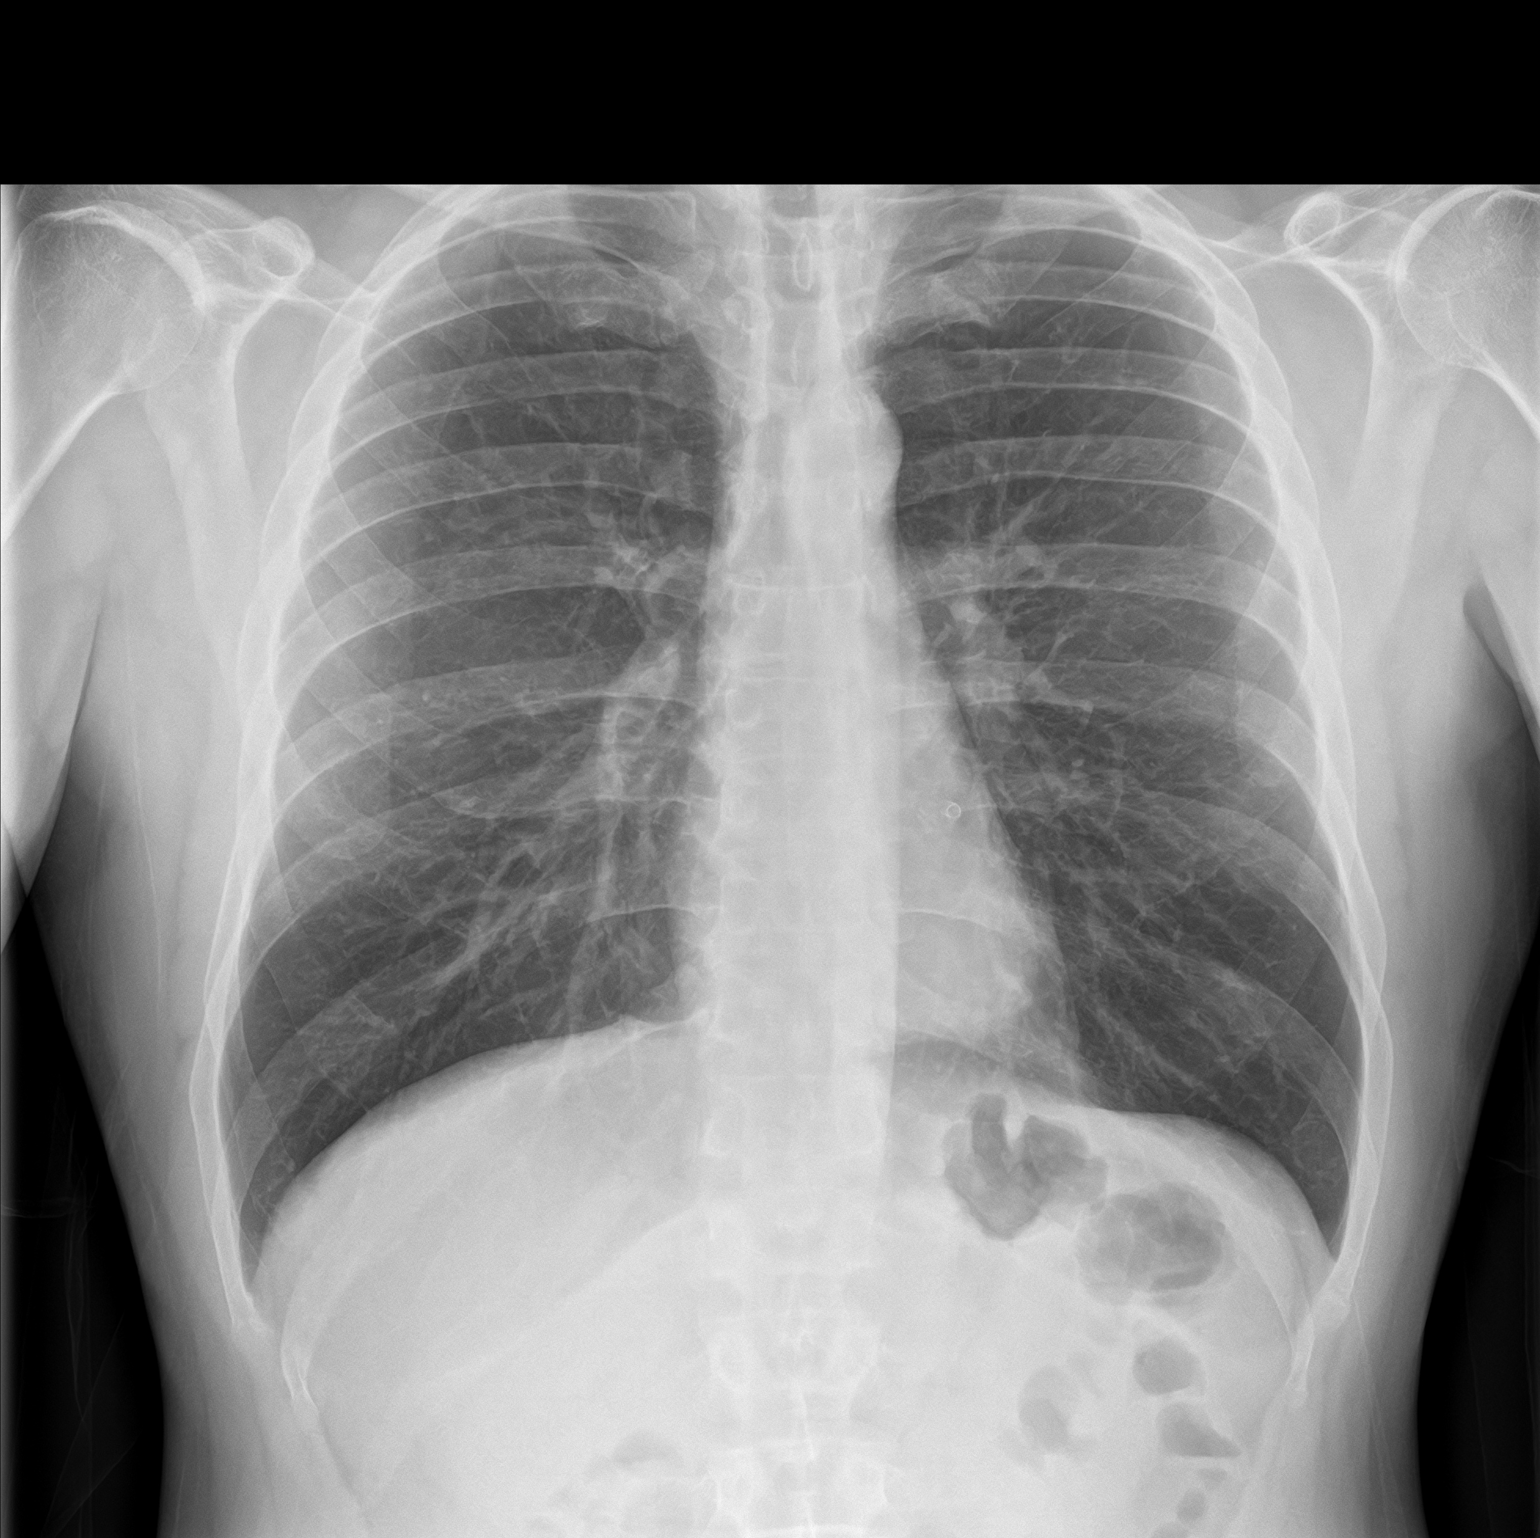

[2 of 2 positions shown; findings below may reference images not displayed]

FINDINGS: The heart size and mediastinal contours are within normal limits.
Both lungs are clear. The visualized skeletal structures are
unremarkable.
IMPRESSION: No active cardiopulmonary disease.

## 2022-05-04 ENCOUNTER — Telehealth: Payer: Self-pay | Admitting: Family

## 2022-05-04 NOTE — Telephone Encounter (Signed)
Pt and wife called in requesting to [facility] to [facility] . Please Advise [phone]

## 2022-05-04 NOTE — Telephone Encounter (Signed)
I am ok with this  

## 2022-05-05 NOTE — Telephone Encounter (Signed)
Left message to return call to our office.  

## 2022-05-05 NOTE — Telephone Encounter (Signed)
I dont see information on his wife. I think it best to continue with their current provider

## 2022-05-05 NOTE — Telephone Encounter (Signed)
Patient wife called back in returning call.

## 2022-05-05 NOTE — Telephone Encounter (Signed)
Not sure what our next steps are other than recommending they look out side the office for new pcp?

## 2022-07-13 DIAGNOSIS — M5481 Occipital neuralgia: Secondary | ICD-10-CM | POA: Diagnosis not present

## 2022-08-11 ENCOUNTER — Telehealth: Payer: Self-pay | Admitting: Primary Care

## 2022-08-11 NOTE — Telephone Encounter (Signed)
Steven Archer

## 2022-08-11 NOTE — Telephone Encounter (Signed)
Can we get this patient set up for [facility] with Audria Nine?  I've discussed this with Matt and the patient's wife.

## 2022-11-24 ENCOUNTER — Ambulatory Visit: Payer: Medicare HMO | Admitting: Nurse Practitioner

## 2022-11-24 ENCOUNTER — Encounter: Payer: Self-pay | Admitting: Nurse Practitioner

## 2022-11-24 VITALS — BP 120/70 | HR 75 | Temp 98.2°F | Ht 69.0 in | Wt 165.0 lb

## 2022-11-24 DIAGNOSIS — R1010 Upper abdominal pain, unspecified: Secondary | ICD-10-CM | POA: Insufficient documentation

## 2022-11-24 DIAGNOSIS — Z1211 Encounter for screening for malignant neoplasm of colon: Secondary | ICD-10-CM

## 2022-11-24 DIAGNOSIS — M5481 Occipital neuralgia: Secondary | ICD-10-CM

## 2022-11-24 DIAGNOSIS — Z125 Encounter for screening for malignant neoplasm of prostate: Secondary | ICD-10-CM | POA: Diagnosis not present

## 2022-11-24 DIAGNOSIS — Z0000 Encounter for general adult medical examination without abnormal findings: Secondary | ICD-10-CM | POA: Diagnosis not present

## 2022-11-24 DIAGNOSIS — S069X9A Unspecified intracranial injury with loss of consciousness of unspecified duration, initial encounter: Secondary | ICD-10-CM

## 2022-11-24 DIAGNOSIS — Z720 Tobacco use: Secondary | ICD-10-CM | POA: Diagnosis not present

## 2022-11-24 DIAGNOSIS — I252 Old myocardial infarction: Secondary | ICD-10-CM | POA: Diagnosis not present

## 2022-11-24 DIAGNOSIS — I2510 Atherosclerotic heart disease of native coronary artery without angina pectoris: Secondary | ICD-10-CM

## 2022-11-24 DIAGNOSIS — Z13220 Encounter for screening for lipoid disorders: Secondary | ICD-10-CM

## 2022-11-24 DIAGNOSIS — Z122 Encounter for screening for malignant neoplasm of respiratory organs: Secondary | ICD-10-CM

## 2022-11-24 DIAGNOSIS — R110 Nausea: Secondary | ICD-10-CM | POA: Diagnosis not present

## 2022-11-24 DIAGNOSIS — G2581 Restless legs syndrome: Secondary | ICD-10-CM

## 2022-11-24 LAB — COMPREHENSIVE METABOLIC PANEL
ALT: 20 U/L (ref 0–53)
AST: 25 U/L (ref 0–37)
Albumin: 3.5 g/dL (ref 3.5–5.2)
Alkaline Phosphatase: 55 U/L (ref 39–117)
BUN: 11 mg/dL (ref 6–23)
CO2: 31 meq/L (ref 19–32)
Calcium: 8.6 mg/dL (ref 8.4–10.5)
Chloride: 105 meq/L (ref 96–112)
Creatinine, Ser: 1.5 mg/dL (ref 0.40–1.50)
GFR: 52.88 mL/min — ABNORMAL LOW (ref >60.00)
Glucose, Bld: 94 mg/dL (ref 70–99)
Potassium: 4.5 meq/L (ref 3.5–5.1)
Sodium: 140 meq/L (ref 135–145)
Total Bilirubin: 0.5 mg/dL (ref 0.2–1.2)
Total Protein: 5 g/dL — ABNORMAL LOW (ref 6.0–8.3)

## 2022-11-24 LAB — TSH: TSH: 2.09 u[IU]/mL (ref 0.35–5.50)

## 2022-11-24 LAB — URINALYSIS, MICROSCOPIC ONLY
RBC / HPF: NONE SEEN (ref 0–?)
WBC, UA: NONE SEEN (ref 0–?)

## 2022-11-24 LAB — LIPID PANEL
Cholesterol: 172 mg/dL (ref 0–200)
HDL: 30.4 mg/dL — ABNORMAL LOW (ref >39.00)
LDL Cholesterol: 118 mg/dL — ABNORMAL HIGH (ref 0–99)
NonHDL: 141.26
Total CHOL/HDL Ratio: 6
Triglycerides: 115 mg/dL (ref 0.0–149.0)
VLDL: 23 mg/dL (ref 0.0–40.0)

## 2022-11-24 LAB — LIPASE: Lipase: 37 U/L (ref 11.0–59.0)

## 2022-11-24 LAB — CBC
HCT: 48 % (ref 39.0–52.0)
Hemoglobin: 16 g/dL (ref 13.0–17.0)
MCHC: 33.2 g/dL (ref 30.0–36.0)
MCV: 95.8 fL (ref 78.0–100.0)
Platelets: 216 10*3/uL (ref 150.0–400.0)
RBC: 5.01 Mil/uL (ref 4.22–5.81)
RDW: 14.1 % (ref 11.5–15.5)
WBC: 8.1 10*3/uL (ref 4.0–10.5)

## 2022-11-24 LAB — PSA, MEDICARE: PSA: 0.67 ng/mL (ref 0.10–4.00)

## 2022-11-24 LAB — HEMOGLOBIN A1C: Hgb A1c MFr Bld: 5.6 % (ref 4.6–6.5)

## 2022-11-24 MED ORDER — OMEPRAZOLE 20 MG PO CPDR: 20.0000 mg | DELAYED_RELEASE_CAPSULE | Freq: Every day | ORAL | 0 refills | Status: DC

## 2022-11-24 MED ORDER — ONDANSETRON HCL 4 MG PO TABS: 4.0000 mg | ORAL_TABLET | Freq: Three times a day (TID) | ORAL | 0 refills | Status: AC | PRN

## 2022-11-24 NOTE — Assessment & Plan Note (Signed)
Check urine microscopy to rule out microscopic hematuria

## 2022-11-24 NOTE — Assessment & Plan Note (Addendum)
Patient is followed by neurosurgery and has a nerve stimulator placed continue following specialty as recommended.  Patient also maintained on pregabalin 100 mg nightly as needed managed through pain clinic

## 2022-11-24 NOTE — Assessment & Plan Note (Signed)
Ambiguous in nature will check lipase and start omeprazole 20 mg daily.

## 2022-11-24 NOTE — Assessment & Plan Note (Signed)
Intermittent in nature and would like to have some Zofran on hand.  Ondansetron 4 mgs 3 times daily as needed sent

## 2022-11-24 NOTE — Assessment & Plan Note (Signed)
History of the same with some sequela patient has altered gait along with some memory issues.

## 2022-11-24 NOTE — Patient Instructions (Signed)
Nice to see you today I will be in touch with the labs once I have them Follow up with me in 1 year, sooner if you need me   

## 2022-11-24 NOTE — Assessment & Plan Note (Signed)
Stent in place MI was in 2006 patient states he does take aspirin but not on statin therapy.

## 2022-11-24 NOTE — Assessment & Plan Note (Signed)
Discussed age-appropriate immunizations and screening exams.  Did review patient's personal, surgical, social, family histories.  Patient is up-to-date on all age-appropriate vaccinations he would like.  Patient refused all vaccines today in office.  Did order Cologuard for colorectal cancer screening.  Did order PSA for prostate cancer screening.  Patient was given information at discharge about preventative healthcare maintenance with anticipatory guidance.

## 2022-11-24 NOTE — Assessment & Plan Note (Signed)
Currently maintained Requip this is managed through pain clinic continue

## 2022-11-24 NOTE — Assessment & Plan Note (Signed)
History of the same not currently on statin medication.  Pending lipid panel

## 2022-11-24 NOTE — Progress Notes (Signed)
Established Patient Office Visit  Subjective   Patient ID: Steven Archer, male    DOB: Nov 17, 1970  Age: 51 y.o. MRN: [id number]  Chief Complaint  Patient presents with   Establish Care    Requests physical and handicap placard     HPI  RLS: Dr. 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 Some headaches but stimulator helps   MI: cardiology prn at [facility].States that they put him on heart medcicine and made it worse. States that he was not placed on statin medication as "he did not need it"  Tobacco: 1ppd and smoked for 41 years    for complete physical and follow up of chronic conditions.  Immunizations: -Tetanus: Completed in unsure  -Influenza: refused -Shingles: Refused -Pneumonia: Refused  Diet: Fair diet. He will eat approx 3 meals a day plus snacks. Pepsi and tea. Some water or gatorad Exercise: No regular exercise. House work and cutting [employer] exam: PRN Dental exam: Needs updating   Colonoscopy: Ordered for Cologuard Lung Cancer Screening: Ambulatory referral to lung Cancer screening program  PSA: Due  Sleep: he will get approx 1-2 hour at a time. States that bed is 11-12 and will kick for hours then will go to sleep around 6 am and sleep 3-4 hours consecutively.     Review of Systems  Constitutional:  Negative for chills and fever.  Respiratory:  Positive for shortness of breath (baseline).   Cardiovascular:  Positive for chest pain (baseline since heart attack). Negative for leg swelling.  Gastrointestinal:  Negative for abdominal pain, blood in stool, constipation, diarrhea, nausea and vomiting.       BM daily   Genitourinary:  Negative for dysuria and hematuria.  Neurological:  Negative for tingling and headaches.  Psychiatric/Behavioral:  Negative for hallucinations and suicidal ideas.        Objective:     BP 120/70   Pulse 75   Temp 98.2 F (36.8 C) (Oral)   Ht 5\' 9"  (1.753 m)   Wt 165 lb (74.8 kg)   SpO2 94%   BMI 24.37 kg/m    Physical Exam Vitals and nursing note reviewed.  Constitutional:      Appearance: Normal appearance.  HENT:     Right Ear: Tympanic membrane, ear canal and external ear normal.     Left Ear: Tympanic membrane, ear canal and external ear normal.     Mouth/Throat:     Mouth: Mucous membranes are moist.     Pharynx: Oropharynx is clear.  Eyes:     Extraocular Movements: Extraocular movements intact.     Pupils: Pupils are equal, round, and reactive to light.  Cardiovascular:     Rate and Rhythm: Normal rate and regular rhythm.     Pulses: Normal pulses.     Heart sounds: Normal heart sounds.  Pulmonary:     Effort: Pulmonary effort is normal.     Breath sounds: Normal breath sounds.  Abdominal:     General: Bowel sounds are normal. There is no distension.     Palpations: There is no mass.     Tenderness: There is no abdominal tenderness.     Hernia: No hernia is present.  Musculoskeletal:     Right lower leg: No edema.     Left lower leg: No edema.  Lymphadenopathy:  Cervical: No cervical adenopathy.  Skin:    General: Skin is warm.  Neurological:     General: No focal deficit present.     Mental Status: He is alert.     Deep Tendon Reflexes:     Reflex Scores:      Bicep reflexes are 2+ on the right side and 2+ on the left side.      Patellar reflexes are 1+ on the right side and 1+ on the left side.    Comments: Bilateral upper and lower extremity strength 5/5  Psychiatric:        Mood and Affect: Mood normal.        Behavior: Behavior normal.        Thought Content: Thought content normal.        Judgment: Judgment normal.      Results for orders placed or performed in visit on 11/24/22  Urine Microscopic  Result Value Ref Range   WBC, UA none seen 0-2/hpf   RBC / HPF none seen 0-2/hpf   Mucus, UA Presence  of (A) None   Squamous Epithelial / HPF Rare(0-4/hpf) Rare(0-4/hpf)      The ASCVD Risk score (Arnett DK, et al., 2019) failed to calculate for the following reasons:   The patient has a prior MI or stroke diagnosis    Assessment & Plan:   Problem List Items Addressed This Visit       Cardiovascular and Mediastinum   Coronary artery disease    History of the same not currently on statin medication.  Pending lipid panel      Relevant Orders   Lipid panel     Nervous and Auditory   Occipital neuralgia of left side    Patient is followed by neurosurgery and has a nerve stimulator placed continue following specialty as recommended.  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        Other   Restless legs syndrome    Currently maintained Requip this is managed through pain clinic continue      History of MI (myocardial infarction)    Stent in place MI was in 2006 patient states he does take aspirin but not on statin therapy.      Relevant Orders   Hemoglobin A1c   Preventative health care - Primary    Discussed age-appropriate immunizations and screening exams.  Did review patient's personal, surgical, social, family histories.  Patient is up-to-date on all age-appropriate vaccinations he would like.  Patient refused all vaccines today in office.  Did order Cologuard for colorectal cancer screening.  Did order PSA for prostate cancer screening.  Patient was given information at discharge about preventative healthcare maintenance with anticipatory guidance.      Relevant Orders   CBC   Comprehensive metabolic panel   TSH   Tobacco use    Check urine microscopy to rule out microscopic hematuria      Relevant Orders   Urine Microscopic  (Completed)   Nausea    Intermittent in nature and would like to have some Zofran on hand.  Ondansetron 4 mgs 3 times daily as needed sent      Relevant Medications   ondansetron (ZOFRAN) 4 MG tablet  Other Relevant Orders   Lipase   Upper abdominal pain    Ambiguous in nature will check lipase and start omeprazole 20 mg daily.      Relevant Medications   omeprazole (PRILOSEC) 20 MG capsule   Other Relevant Orders   Lipase   Other Visit Diagnoses     Screening for colon cancer       Relevant Orders   Cologuard   Screening for prostate cancer       Relevant Orders   PSA, Medicare   Screening for lung cancer       Relevant Orders   Ambulatory Referral Lung Cancer Screening  Pulmonary       Return in about 1 year (around 11/24/2023) for CPE and Labs.    Audria Nine, NP

## 2022-12-20 DIAGNOSIS — L299 Pruritus, unspecified: Secondary | ICD-10-CM | POA: Diagnosis not present

## 2022-12-20 DIAGNOSIS — R0683 Snoring: Secondary | ICD-10-CM | POA: Diagnosis not present

## 2022-12-20 DIAGNOSIS — G2581 Restless legs syndrome: Secondary | ICD-10-CM | POA: Diagnosis not present

## 2023-01-11 DIAGNOSIS — G2581 Restless legs syndrome: Secondary | ICD-10-CM | POA: Diagnosis not present

## 2023-01-11 DIAGNOSIS — L299 Pruritus, unspecified: Secondary | ICD-10-CM | POA: Diagnosis not present

## 2023-01-11 DIAGNOSIS — M5481 Occipital neuralgia: Secondary | ICD-10-CM | POA: Diagnosis not present

## 2023-01-17 DIAGNOSIS — I25119 Atherosclerotic heart disease of native coronary artery with unspecified angina pectoris: Secondary | ICD-10-CM | POA: Diagnosis not present

## 2023-01-17 DIAGNOSIS — Z7982 Long term (current) use of aspirin: Secondary | ICD-10-CM | POA: Diagnosis not present

## 2023-01-17 DIAGNOSIS — Z809 Family history of malignant neoplasm, unspecified: Secondary | ICD-10-CM | POA: Diagnosis not present

## 2023-01-17 DIAGNOSIS — Z720 Tobacco use: Secondary | ICD-10-CM | POA: Diagnosis not present

## 2023-01-17 DIAGNOSIS — Z5948 Other specified lack of adequate food: Secondary | ICD-10-CM | POA: Diagnosis not present

## 2023-01-17 DIAGNOSIS — I252 Old myocardial infarction: Secondary | ICD-10-CM | POA: Diagnosis not present

## 2023-01-17 DIAGNOSIS — Z818 Family history of other mental and behavioral disorders: Secondary | ICD-10-CM | POA: Diagnosis not present

## 2023-01-17 DIAGNOSIS — Z5986 Financial insecurity: Secondary | ICD-10-CM | POA: Diagnosis not present

## 2023-01-17 DIAGNOSIS — G2581 Restless legs syndrome: Secondary | ICD-10-CM | POA: Diagnosis not present

## 2023-09-26 DIAGNOSIS — G2581 Restless legs syndrome: Secondary | ICD-10-CM | POA: Diagnosis not present

## 2023-09-26 DIAGNOSIS — L299 Pruritus, unspecified: Secondary | ICD-10-CM | POA: Diagnosis not present

## 2023-09-26 DIAGNOSIS — R0683 Snoring: Secondary | ICD-10-CM | POA: Diagnosis not present

## 2023-11-27 ENCOUNTER — Ambulatory Visit: Payer: Medicare HMO | Admitting: Nurse Practitioner

## 2023-11-27 ENCOUNTER — Encounter: Payer: Self-pay | Admitting: Nurse Practitioner

## 2023-11-27 VITALS — BP 100/80 | HR 65 | Temp 97.9°F | Ht 68.75 in | Wt 155.4 lb

## 2023-11-27 DIAGNOSIS — Z114 Encounter for screening for human immunodeficiency virus [HIV]: Secondary | ICD-10-CM | POA: Diagnosis not present

## 2023-11-27 DIAGNOSIS — R0602 Shortness of breath: Secondary | ICD-10-CM

## 2023-11-27 DIAGNOSIS — Z1159 Encounter for screening for other viral diseases: Secondary | ICD-10-CM

## 2023-11-27 DIAGNOSIS — Z126 Encounter for screening for malignant neoplasm of bladder: Secondary | ICD-10-CM | POA: Diagnosis not present

## 2023-11-27 DIAGNOSIS — Z122 Encounter for screening for malignant neoplasm of respiratory organs: Secondary | ICD-10-CM

## 2023-11-27 DIAGNOSIS — M5481 Occipital neuralgia: Secondary | ICD-10-CM | POA: Diagnosis not present

## 2023-11-27 DIAGNOSIS — Z5320 Procedure and treatment not carried out because of patient's decision for unspecified reasons: Secondary | ICD-10-CM

## 2023-11-27 DIAGNOSIS — I252 Old myocardial infarction: Secondary | ICD-10-CM

## 2023-11-27 DIAGNOSIS — Z0000 Encounter for general adult medical examination without abnormal findings: Secondary | ICD-10-CM | POA: Diagnosis not present

## 2023-11-27 DIAGNOSIS — S069X9A Unspecified intracranial injury with loss of consciousness of unspecified duration, initial encounter: Secondary | ICD-10-CM

## 2023-11-27 DIAGNOSIS — G2581 Restless legs syndrome: Secondary | ICD-10-CM

## 2023-11-27 DIAGNOSIS — I2510 Atherosclerotic heart disease of native coronary artery without angina pectoris: Secondary | ICD-10-CM

## 2023-11-27 DIAGNOSIS — Z125 Encounter for screening for malignant neoplasm of prostate: Secondary | ICD-10-CM

## 2023-11-27 LAB — CBC WITH DIFFERENTIAL/PLATELET
Basophils Absolute: 0 K/uL (ref 0.0–0.1)
Basophils Relative: 0.5 % (ref 0.0–3.0)
Eosinophils Absolute: 0.1 K/uL (ref 0.0–0.7)
Eosinophils Relative: 1.9 % (ref 0.0–5.0)
HCT: 46.5 % (ref 39.0–52.0)
Hemoglobin: 15.6 g/dL (ref 13.0–17.0)
Lymphocytes Relative: 42 % (ref 12.0–46.0)
Lymphs Abs: 2.3 K/uL (ref 0.7–4.0)
MCHC: 33.6 g/dL (ref 30.0–36.0)
MCV: 94.2 fl (ref 78.0–100.0)
Monocytes Absolute: 0.4 K/uL (ref 0.1–1.0)
Monocytes Relative: 7.4 % (ref 3.0–12.0)
Neutro Abs: 2.6 K/uL (ref 1.4–7.7)
Neutrophils Relative %: 48.2 % (ref 43.0–77.0)
Platelets: 223 K/uL (ref 150.0–400.0)
RBC: 4.94 Mil/uL (ref 4.22–5.81)
RDW: 14.1 % (ref 11.5–15.5)
WBC: 5.4 K/uL (ref 4.0–10.5)

## 2023-11-27 LAB — LIPID PANEL
Cholesterol: 159 mg/dL (ref 0–200)
HDL: 36.9 mg/dL — ABNORMAL LOW (ref >39.00)
LDL Cholesterol: 108 mg/dL — ABNORMAL HIGH (ref 0–99)
NonHDL: 122.25
Total CHOL/HDL Ratio: 4
Triglycerides: 71 mg/dL (ref 0.0–149.0)
VLDL: 14.2 mg/dL (ref 0.0–40.0)

## 2023-11-27 LAB — COMPREHENSIVE METABOLIC PANEL WITH GFR
ALT: 14 U/L (ref 0–53)
AST: 21 U/L (ref 0–37)
Albumin: 4.5 g/dL (ref 3.5–5.2)
Alkaline Phosphatase: 72 U/L (ref 39–117)
BUN: 17 mg/dL (ref 6–23)
CO2: 33 meq/L — ABNORMAL HIGH (ref 19–32)
Calcium: 9.4 mg/dL (ref 8.4–10.5)
Chloride: 104 meq/L (ref 96–112)
Creatinine, Ser: 1.43 mg/dL (ref 0.40–1.50)
GFR: 55.6 mL/min — ABNORMAL LOW (ref >60.00)
Glucose, Bld: 92 mg/dL (ref 70–99)
Potassium: 4.4 meq/L (ref 3.5–5.1)
Sodium: 140 meq/L (ref 135–145)
Total Bilirubin: 0.7 mg/dL (ref 0.2–1.2)
Total Protein: 6.5 g/dL (ref 6.0–8.3)

## 2023-11-27 LAB — HEMOGLOBIN A1C: Hgb A1c MFr Bld: 5.5 % (ref 4.6–6.5)

## 2023-11-27 LAB — PSA, MEDICARE: PSA: 0.5 ng/mL (ref 0.10–4.00)

## 2023-11-27 LAB — URINALYSIS, MICROSCOPIC ONLY: RBC / HPF: NONE SEEN (ref 0–?)

## 2023-11-27 LAB — TSH: TSH: 1.2 u[IU]/mL (ref 0.35–5.50)

## 2023-11-27 NOTE — Assessment & Plan Note (Signed)
 Followed by neurology and neurosurgery.  Patient has peripheral nerve stimulator.  Continue follow-up with specialist as recommended

## 2023-11-27 NOTE — Assessment & Plan Note (Signed)
 History of the same with MI and stenting.  Patient followed by cardiology.  Patient on statin.  Explained and encouraged statin medication today.  Patient declined ambulatory referral to cardiology today

## 2023-11-27 NOTE — Assessment & Plan Note (Signed)
 Stable patient is followed by neurology continue

## 2023-11-27 NOTE — Assessment & Plan Note (Signed)
 Currently maintained on pregabalin 150 mg nightly.  Followed by neurology.  Did review most recent iron panel and ferritin

## 2023-11-27 NOTE — Progress Notes (Signed)
 Established Patient Office Visit  Subjective   Patient ID: Steven Archer, male    DOB: 1970/12/20  Age: 53 y.o. MRN: [id number]  Chief Complaint  Patient presents with   Annual Exam    HPI  RLS: Currently maintained on pregabalin 150 mg nightly.  Patient is followed by Rankin Finder, [facility] neurology  Occipital neuralgia: On Lyrica 150 mg nightly and followed by Rankin Finder, [facility] neurology.  Patient also has a peripheral nerve stimulator and is followed by Dr. Vibhor Krishna through [facility] neurosurgery   for complete physical and follow up of chronic conditions.  Immunizations: -Tetanus: Completed in unsure, refused  -Influenza: refused -Shingles: Refused -Pneumonia: Refused  Diet: Fair diet. He will eat 2 meals a day sometimes he will eat every couple hours. He will do tea. Pepsi and water  Exercise: No regular exercise. Splitting wood and yard work   Eye exam: PRN Dental exam: Needs updating    Colonoscopy: Ordered Cologuard but never completed Lung Cancer Screening: Qualifies  PSA: Due  Sleep: going to bed around 11-1 and will get up around 10-12. States sometimes earlyier    Sharp and dull pain that can radiate down the left arm. He scan he sitting still and it will happen. States that it can last 15 min  Shortness of breath is intermittent and can be with acitivey or without. States that he has trouble getting a deep enough breath. States that he will get it several times a week that last for a few minutes       Review of Systems  Constitutional:  Negative for chills and fever.  Respiratory:  Negative for shortness of breath.   Cardiovascular:  Positive for chest pain. Negative for leg swelling.  Gastrointestinal:  Negative for abdominal pain, blood in stool, constipation, diarrhea, nausea and vomiting.       BM daily   Genitourinary:  Negative for dysuria and hematuria.  Neurological:  Positive for dizziness (orthostatic). Negative for tingling and  headaches.  Psychiatric/Behavioral:  Negative for hallucinations and suicidal ideas.       Objective:     BP 100/80   Pulse 65   Temp 97.9 F (36.6 C) (Oral)   Ht 5' 8.75 (1.746 m)   Wt 155 lb 6.4 oz (70.5 kg)   SpO2 98%   BMI 23.12 kg/m  BP Readings from Last 3 Encounters:  11/27/23 100/80  11/24/22 120/70  07/20/21 98/62   Wt Readings from Last 3 Encounters:  11/27/23 155 lb 6.4 oz (70.5 kg)  11/24/22 165 lb (74.8 kg)  07/20/21 150 lb 6 oz (68.2 kg)   SpO2 Readings from Last 3 Encounters:  11/27/23 98%  11/24/22 94%  07/20/21 97%      Physical Exam Vitals and nursing note reviewed.  Constitutional:      Appearance: Normal appearance.  HENT:     Right Ear: Tympanic membrane, ear canal and external ear normal.     Left Ear: Tympanic membrane, ear canal and external ear normal.     Mouth/Throat:     Mouth: Mucous membranes are moist.     Pharynx: Oropharynx is clear.  Eyes:     Extraocular Movements: Extraocular movements intact.     Pupils: Pupils are equal, round, and reactive to light.  Cardiovascular:     Rate and Rhythm: Normal rate and regular rhythm.     Pulses: Normal pulses.     Heart sounds: Normal heart sounds.  Pulmonary:  Effort: Pulmonary effort is normal.     Breath sounds: Normal breath sounds.  Abdominal:     General: Bowel sounds are normal. There is no distension.     Palpations: There is no mass.     Tenderness: There is no abdominal tenderness.     Hernia: No hernia is present.  Genitourinary:    Comments: deferred Musculoskeletal:     Right lower leg: No edema.     Left lower leg: No edema.  Lymphadenopathy:     Cervical: No cervical adenopathy.  Skin:    General: Skin is warm.  Neurological:     General: No focal deficit present.     Mental Status: He is alert.     Deep Tendon Reflexes:     Reflex Scores:      Bicep reflexes are 2+ on the right side and 2+ on the left side.      Patellar reflexes are 2+ on the right  side and 2+ on the left side.    Comments: Bilateral upper and lower extremity strength 5/5  Psychiatric:        Mood and Affect: Mood normal.        Behavior: Behavior normal.        Thought Content: Thought content normal.        Judgment: Judgment normal.      No results found for any visits on 11/27/23.    The ASCVD Risk score (Arnett DK, et al., 2019) failed to calculate for the following reasons:   Risk score cannot be calculated because patient has a medical history suggesting prior/existing ASCVD    Assessment & Plan:   Problem List Items Addressed This Visit       Cardiovascular and Mediastinum   Coronary artery disease   History of the same with MI and stenting.  Patient followed by cardiology.  Patient on statin.  Explained and encouraged statin medication today.  Patient declined ambulatory referral to cardiology today      Relevant Orders   Hemoglobin A1c   Lipid panel   Ambulatory referral to Cardiology     Nervous and Auditory   Occipital neuralgia of left side   Followed by neurology and neurosurgery.  Patient has peripheral nerve stimulator.  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  Followed by neurology.  Did review most recent iron panel and ferritin      Relevant Orders   CBC with Differential/Platelet   Comprehensive metabolic panel with GFR   TSH   History of MI (myocardial infarction)   History of same not on antiantiplatelet therapy nor followed by cardiology or taking a statin.  Ambulatory referral to cardiology today      Relevant Orders   Hemoglobin A1c   Lipid panel   Ambulatory referral to Cardiology   Preventative health care - Primary   Discussed age-appropriate immunizations and screening  exams.  Did review patient's personal, surgical, social, family history.  Patient up-to-date with all age-appropriate vaccinations he would like.  Patient refused all vaccines today.  Patient refused any form of CRC screening.  Ambulatory referral for LDCT.  PSA for prostate cancer screening.  Patient was given information at discharge about preventative  healthcare maintenance with anticipatory guidance      Relevant Orders   CBC with Differential/Platelet   Comprehensive metabolic panel with GFR   TSH   Other Visit Diagnoses       Encounter for hepatitis C screening test for low risk patient       Relevant Orders   Hepatitis C antibody     Encounter for screening for HIV       Relevant Orders   HIV antibody (with reflex)     Screening for prostate cancer       Relevant Orders   PSA, Medicare     Screening for bladder cancer       Relevant Orders   Urine Microscopic     Shortness of breath       Relevant Orders   Ambulatory referral to Cardiology     Screening for lung cancer       Relevant Orders   Ambulatory Referral Lung Cancer Screening [facility]     Colonoscopy refused           Return in about 1 year (around 11/26/2024) for CPE and Labs.    Adina Crandall, NP

## 2023-11-27 NOTE — Assessment & Plan Note (Signed)
 Discussed age-appropriate immunizations and screening exams.  Did review patient's personal, surgical, social, family history.  Patient up-to-date with all age-appropriate vaccinations he would like.  Patient refused all vaccines today.  Patient refused any form of CRC screening.  Ambulatory referral for LDCT.  PSA for prostate cancer screening.  Patient was given information at discharge about preventative healthcare maintenance with anticipatory guidance

## 2023-11-27 NOTE — Patient Instructions (Signed)
 Nice to see you today I will be in touch with the labs once I have them  I have made 2 referrals for you today  Follow up with me in 1 year, sooner if you need me

## 2023-11-27 NOTE — Assessment & Plan Note (Signed)
 History of same not on antiantiplatelet therapy nor followed by cardiology or taking a statin.  Ambulatory referral to cardiology today

## 2023-11-28 LAB — HIV ANTIBODY (ROUTINE TESTING W REFLEX)
HIV 1&2 Ab, 4th Generation: NONREACTIVE
HIV FINAL INTERPRETATION: NEGATIVE

## 2023-11-28 LAB — HEPATITIS C ANTIBODY: Hepatitis C Ab: NONREACTIVE

## 2023-12-01 ENCOUNTER — Ambulatory Visit: Payer: Self-pay | Admitting: Nurse Practitioner

## 2023-12-21 ENCOUNTER — Encounter: Payer: Self-pay | Admitting: Cardiology

## 2023-12-21 ENCOUNTER — Ambulatory Visit: Admitting: Cardiology

## 2023-12-21 VITALS — BP 112/74 | HR 72 | Ht 70.0 in | Wt 158.2 lb

## 2023-12-21 DIAGNOSIS — Z79899 Other long term (current) drug therapy: Secondary | ICD-10-CM

## 2023-12-21 DIAGNOSIS — Z8679 Personal history of other diseases of the circulatory system: Secondary | ICD-10-CM | POA: Diagnosis not present

## 2023-12-21 DIAGNOSIS — G8929 Other chronic pain: Secondary | ICD-10-CM | POA: Diagnosis not present

## 2023-12-21 DIAGNOSIS — I2510 Atherosclerotic heart disease of native coronary artery without angina pectoris: Secondary | ICD-10-CM

## 2023-12-21 DIAGNOSIS — Z9861 Coronary angioplasty status: Secondary | ICD-10-CM | POA: Diagnosis not present

## 2023-12-21 DIAGNOSIS — Z720 Tobacco use: Secondary | ICD-10-CM | POA: Diagnosis not present

## 2023-12-21 NOTE — Assessment & Plan Note (Signed)
 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  Has done fine from that standpoint but is still hide some issues with left greater than right hand dexterity.  As far as I can tell he has a breakthrough spells that are prolonged just some short little tachycardia spells that do not bother him very much.  No longer on beta-blocker.  Discussed vagal movements.

## 2023-12-21 NOTE — Assessment & Plan Note (Addendum)
 LAD PCI in the setting of MI back in 2006.  Has not really had any issues since.  He has had multiple different stress tests both Myoview and stress echoes that were nonischemic.  No real anginal symptoms, just some random symptoms.  He takes 81 mg aspirin .  BP well-controlled and therefore does not need blood pressure medications. His lipids are not at goal but he self stopped multiple different statins in the past and is reluctant to take any medications including Zetia. He still smokes at least a pack a day.  Not really interested in stopping.  Counseling provided.  Elevated LDL at 108 mg/dL. Smoker, increasing risk of recurrent cardiac events. Not on statin due to adverse effects. - Advise dietary modifications to reduce cholesterol intake. - Encourage smoking cessation. - Recheck lipid panel, liver function tests, and lipoprotein(a) levels in April. - Consider non-statin cholesterol-lowering medication if lifestyle modifications are insufficient.

## 2023-12-21 NOTE — Patient Instructions (Signed)
 Medication Instructions:   Your physician recommends that you continue on your current medications as directed. Please refer to the Current Medication list given to you today.    *If you need a refill on your cardiac medications before your next appointment, please call your pharmacy*  Lab Work:  Your provider would like for you to return in Late March - Early April to have the following labs drawn: Lipid Panel, Hepatic Function Panel.   Please go to [facility] [address] ([facility] [address], [place] [address] You do not need an appointment.  They are open from 8 am- 4:30 pm.  Lunch from 1:00 pm- 2:00 pm You DO need to be fasting.   You may also go to one of the following [facility]:  [address] [place], [place] [address] Phone: [phone] Lab hours: Mon-Fri 8 am- 5 pm    Lunch 12 pm- 1 pm  [address] [place],  [place]  [address]  [place]  Phone: [phone] Lab hours: 7 am- 4 pm Lunch 12 pm-1 pm   [address] [place],  [place]  [address]  [place]  Phone: [phone] Lab hours: Mon-Fri 8 am- 5 pm    Lunch 12 pm- 1 pm   If you have labs (blood work) drawn today and your tests are completely normal, you will receive your results only by:  MyChart Message (if you have MyChart) OR  A paper copy in the mail If you have any lab test that is abnormal or we need to change your treatment, we will call you to review the results.  Testing/Procedures:  None ordered at this time   Referrals:  None ordered at this time   Follow-Up:  At [facility], you and your health needs are our priority.  As part of our continuing mission to provide you with exceptional heart care, our providers are all part of one team.  This team includes your primary Cardiologist (physician) and Advanced Practice Providers or APPs (Physician Assistants and Nurse Practitioners) who all work together to provide you with the care you need, when you need it.  Your next  appointment:   6 month(s)  Provider:    Alm Clay, MD    We recommend signing up for the patient portal called MyChart.  Sign up information is provided on this After Visit Summary.  MyChart is used to connect with patients for Virtual Visits (Telemedicine).  Patients are able to view lab/test results, encounter notes, upcoming appointments, etc.  Non-urgent messages can be sent to your provider as well.   To learn more about what you can do with MyChart, go to [email or web address].

## 2023-12-21 NOTE — Assessment & Plan Note (Signed)
 I think a lot of the symptoms she is describing is chronic pain and not true anginal symptoms.  He has had lots of different chest pain episodes evaluated with stress test.

## 2023-12-21 NOTE — Progress Notes (Signed)
 Cardiology Office Note:  .   Date:  12/21/2023  ID:  Steven Archer, DOB January 07, 1970, MRN [id number] PCP: Wendee Lynwood HERO, NP   HeartCare Providers Cardiologist:  None     Chief Complaint  Patient presents with   New Patient (Initial Visit)    History of MI (myocardial infarction), Coronary artery disease involving native coronary artery of native heart, and shortness of breath. Patient is doing well on today. Meds reviewed.     Patient Profile: .     Steven Archer is a 53 y.o. male smoker with a PMH notable for CAD-BMS PCI to the LAD in 2006 as well as SVT/AVNRT (ablation 2008), OSA not using CPAP, history of TBI who presents here to reestablish cardiology care at the request of Wendee Lynwood HERO, NP.  PMH: CAD: BMS to LAD (2006) AVNRT-s/p ablation (2008) OSA on CPAP History of TBI     Steven Archer was last seen at [facility] on 08/16/2021 for preop evaluation.  With several nonischemic stress test done since his PCI, was cleared for surgery.  He was seen by Lynwood Grow, NP on November 24 and noted sharp and dull chest pain that can radiate down his left arm usually happens when he is sitting.  Can last up to 15 minutes.  Also noted intermittent dyspnea with or without activity.  Sometimes weak feels like he cannot catch his breath.  Referral to cardiology placed but he declined  Subjective  Discussed the use of AI scribe software for clinical note transcription with the patient, who gave verbal consent to proceed.  History of Present Illness Steven Archer is a 53 year old male with coronary artery disease and supraventricular tachycardia who presents for establishment of care. He is accompanied by his wife, Steven Archer.  He has coronary artery disease, having suffered a myocardial infarction in 2006, which led to the placement of a stent in the left anterior descending artery. Post-myocardial infarction, he experienced adverse effects from  cholesterol medications, including chest pain and left arm discomfort, leading to discontinuation. Currently, he is not on any cholesterol-lowering therapy. His recent lipid panel showed a total cholesterol of 159 mg/dL and an LDL of 891 mg/dL. He takes aspirin , Zofran , and Lyrica regularly.  In 2007, he underwent an ablation procedure for supraventricular tachycardia. He occasionally experiences episodes of palpitations lasting up to 30 minutes, sometimes accompanied by dizziness and lightheadedness. These episodes are managed by resting. No significant chest pain, shortness of breath, or swelling in his legs.  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 A stimulator implant has helped manage his pain and improved his mobility and activity levels.  He smokes about a pack of cigarettes a day and is physically active, engaging in activities like splitting wood. He denies any significant weight gain and reports losing weight recently. No high blood pressure, muscle aches, or cramping.  Cardiovascular ROS: positive for - chest pain, irregular heartbeat, palpitations, rapid heart rate, shortness of breath, and no rhyme or reason to these symptoms.  Can happen randomly without any specific timing. negative for - dyspnea on exertion, orthopnea, paroxysmal nocturnal dyspnea, or syncope/near syncope or TIA/amaurosis fugax.  Claudication  ROS:  Review of Systems - Negative except some neurologic issues, muscle twitches and various pain symptoms from TBI.    Objective   Family History: Father  with brain cancer and hypertension.  Father also had stroke.  Medications: 81 mg aspirin , Lyrica 100 mg nightly as needed and Zofran  4 mg as needed.  Very reluctant to take medications.  Studies Reviewed: SABRA   EKG Interpretation Date/Time:  Thursday December 21 2023 15:10:17 EST Ventricular Rate:  72 PR Interval:  148 QRS  Duration:  96 QT Interval:  386 QTC Calculation: 422 R Axis:   82  Text Interpretation: Normal sinus rhythm Incomplete right bundle branch block When compared with ECG of 17-Jul-2019 15:55, No significant change was found Confirmed by Anner Lenis ([id number]) on 12/21/2023 3:22:05 PM   Lab Results  Component Value Date   CHOL 159 11/27/2023   HDL 36.90 (L) 11/27/2023   LDLCALC 108 (H) 11/27/2023   TRIG 71.0 11/27/2023   CHOLHDL 4 11/27/2023   Lab Results  Component Value Date   NA 140 11/27/2023   K 4.4 11/27/2023   CREATININE 1.43 11/27/2023   GFR 55.60 (L) 11/27/2023   GLUCOSE 92 11/27/2023   Lab Results  Component Value Date   ALT 14 11/27/2023   AST 21 11/27/2023   ALKPHOS 72 11/27/2023   BILITOT 0.7 11/27/2023   Lab Results  Component Value Date   HGBA1C 5.5 11/27/2023   Dobutamine stress echo (09/29/2015-[facility]): Negative for ischemia or infarction.  Normal EF. Lexiscan Myoview (02/07/2018-[facility]): Normal EF 55 to 60% (58%).  No Ischemia or Infarction  Risk Assessment/Calculations:               Physical Exam:   VS:  BP 112/74   Pulse 72   Ht 5' 10 (1.778 m)   Wt 158 lb 3.2 oz (71.8 kg)   SpO2 98%   BMI 22.70 kg/m    Wt Readings from Last 3 Encounters:  12/21/23 158 lb 3.2 oz (71.8 kg)  11/27/23 155 lb 6.4 oz (70.5 kg)  11/24/22 165 lb (74.8 kg)     GEN: Well nourished, well groomed in no acute distress; relatively healthy appearing.  Smells of cigarettes. NECK: No JVD; No carotid bruits CARDIAC: Normal S1, S2; RRR, no murmurs, rubs, gallops RESPIRATORY:  Clear to auscultation without rales, wheezing or rhonchi ; nonlabored, good air movement. ABDOMEN: Soft, non-tender, non-distended EXTREMITIES:  No edema; No deformity      ASSESSMENT AND PLAN: .    Problem List Items Addressed This Visit       Cardiology Problems   CAD S/P percutaneous coronary angioplasty - Primary (Chronic)   LAD PCI in the setting of MI back in 2006.  Has not really had any  issues since.  He has had multiple different stress tests both Myoview and stress echoes that were nonischemic.  No real anginal symptoms, just some random symptoms.  He takes 81 mg aspirin .  BP well-controlled and therefore does not need blood pressure medications. His lipids are not at goal but he self stopped multiple different statins in the past and is reluctant to take any medications including Zetia. He still smokes at least a pack a day.  Not really interested in stopping.  Counseling provided.  Elevated LDL at 108 mg/dL. Smoker, increasing risk of recurrent cardiac events. Not on statin due to adverse effects. - Advise dietary modifications to reduce cholesterol intake. - Encourage smoking cessation. - Recheck lipid panel, liver function tests, and lipoprotein(a) levels in April. - Consider non-statin cholesterol-lowering medication if lifestyle modifications are insufficient.      Relevant Orders   EKG 12-Lead (Completed)  Other   H/O paroxysmal supraventricular tachycardia (Chronic)   Per chart, I had what sounded like AVNRT ablation in 2004.  Has done fine from that standpoint but is still hide some issues with left greater than right hand dexterity.  As far as I can tell he has a breakthrough spells that are prolonged just some short little tachycardia spells that do not bother him very much.  No longer on beta-blocker.  Discussed vagal movements.      Other chronic pain   I think a lot of the symptoms she is describing is chronic pain and not true anginal symptoms.  He has had lots of different chest pain episodes evaluated with stress test.      Tobacco use (Chronic)   Smoking cessation instruction/counseling given:  counseled patient on the dangers of tobacco use, advised patient to stop smoking, and reviewed strategies to maximize success.  3 minutes       Other Visit Diagnoses       Medication management       Relevant Orders   Lipid panel   Hepatic function  panel       Assessment and Plan Traumatic Brain Injury (TBI) TBI with altered pain perception. Spinal cord stimulator improved activity levels and reduced leg pain.  Restless Legs Syndrome (RLS) RLS symptoms potentially related to TBI and pain management. Spinal cord stimulator provided some relief. - Monitor for changes in symptoms or treatment effectiveness.  Follow-up Establishing care with new cardiologist. Current management focuses on symptom monitoring and lifestyle modifications. - Schedule follow-up in June to assess progress and review lab results. - Coordinate with primary care provider for ongoing management and lab testing.         Follow-Up: Return in about 6 months (around 06/20/2024).  I spent 35 minutes in the care of Steven Archer today including reviewing labs (2 minutes), reviewing outside studies (prior Report reviewed along with [facility] multiple different studies-9 minutes send), face to face time discussing treatment options (17 minutes), reviewing records from reviewing through cardiology notes (6 minutes), 7 minutes, and documenting in the encounter.      Signed, Alm MICAEL Clay, MD, MS Alm Clay, M.D., M.S. Interventional Cardiologist  [facility] Pager # [phone]

## 2023-12-21 NOTE — Assessment & Plan Note (Signed)
 Smoking cessation instruction/counseling given:  counseled patient on the dangers of tobacco use, advised patient to stop smoking, and reviewed strategies to maximize success.  3 minutes
